# Patient Record
Sex: Female | Born: 1985 | Race: Asian | Hispanic: No | Marital: Single | Smoking: Never smoker
Health system: Southern US, Community
[De-identification: ages and names within clinical notes are randomized; demographics above are authoritative.]

## PROBLEM LIST (undated history)

## (undated) DIAGNOSIS — D649 Anemia, unspecified: Secondary | ICD-10-CM

## (undated) DIAGNOSIS — A599 Trichomoniasis, unspecified: Secondary | ICD-10-CM

## (undated) HISTORY — DX: Trichomoniasis, unspecified: A59.9

---
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
## 2005-07-07 ENCOUNTER — Emergency Department (HOSPITAL_COMMUNITY): Admission: EM | Admit: 2005-07-07 | Discharge: 2005-07-07 | Payer: Self-pay | Admitting: Emergency Medicine

## 2008-02-29 ENCOUNTER — Inpatient Hospital Stay (HOSPITAL_COMMUNITY): Admission: AD | Admit: 2008-02-29 | Discharge: 2008-02-29 | Payer: Self-pay | Admitting: Obstetrics & Gynecology

## 2008-08-23 ENCOUNTER — Inpatient Hospital Stay (HOSPITAL_COMMUNITY): Admission: AD | Admit: 2008-08-23 | Discharge: 2008-08-24 | Payer: Self-pay | Admitting: Obstetrics

## 2008-09-25 ENCOUNTER — Inpatient Hospital Stay (HOSPITAL_COMMUNITY): Admission: AD | Admit: 2008-09-25 | Discharge: 2008-09-25 | Payer: Self-pay | Admitting: Obstetrics

## 2008-10-03 ENCOUNTER — Inpatient Hospital Stay (HOSPITAL_COMMUNITY): Admission: RE | Admit: 2008-10-03 | Discharge: 2008-10-07 | Payer: Self-pay | Admitting: Obstetrics

## 2009-02-08 IMAGING — US US OB COMP LESS 14 WK
1 series · 14 of 23 positions shown · non-contrast
Comparison: none

OBSTETRICAL ULTRASOUND:
 This ultrasound exam was performed in the [HOSPITAL] Ultrasound Department.  The OB US report was generated in the AS system, and faxed to the ordering physician.  This report is also available in [REDACTED] PACS.

[Series 1: us ob comp less 14 wk · 23 acquisitions, 14 frames shown]
[im 1/23]
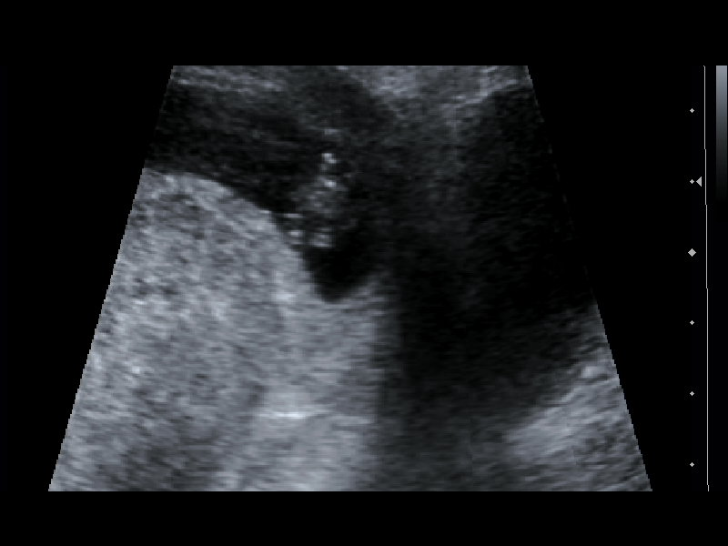
[im 3/23]
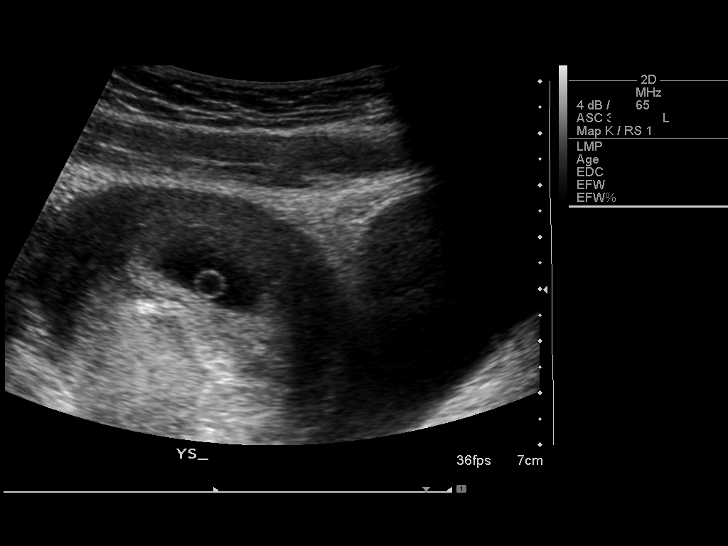
[im 5/23]
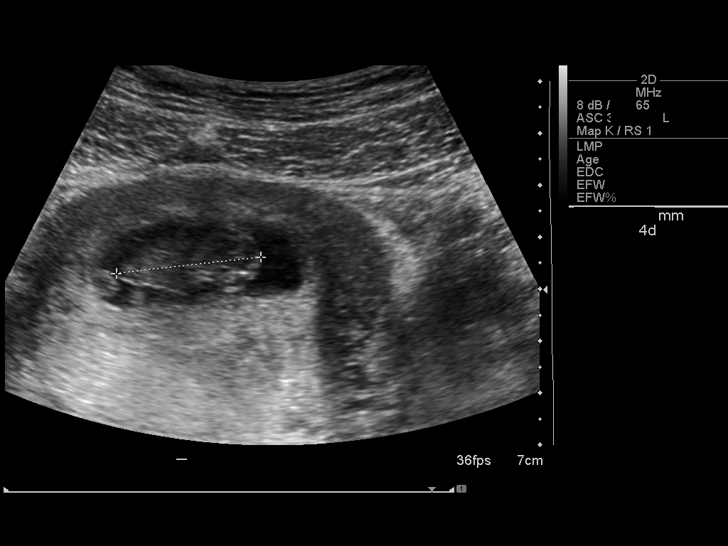
[im 6/23]
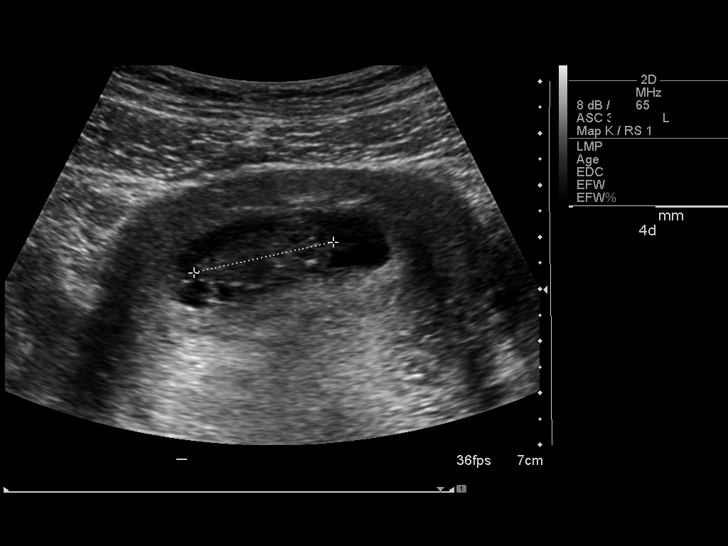
[im 8/23]
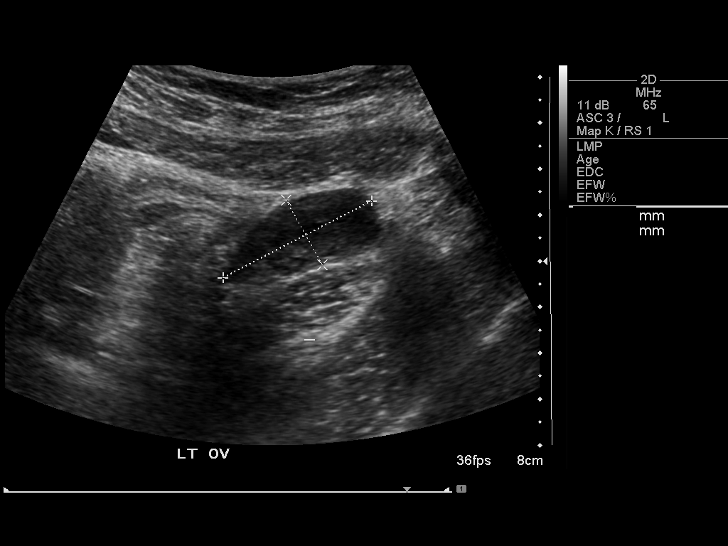
[im 10/23]
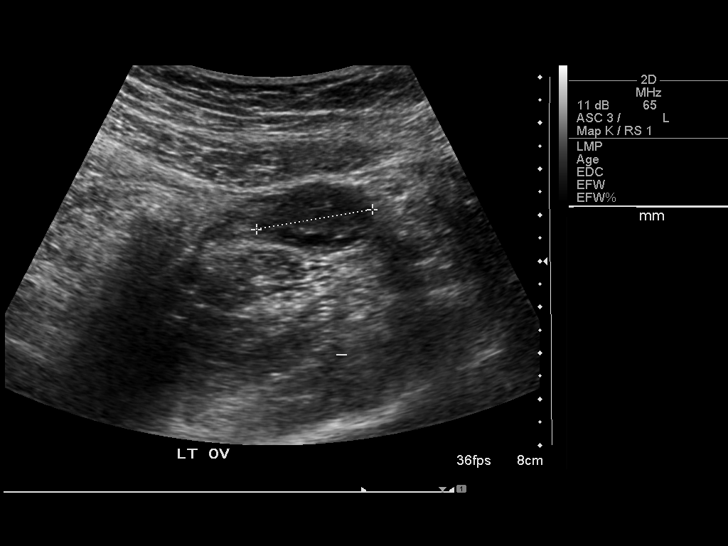
[im 11/23]
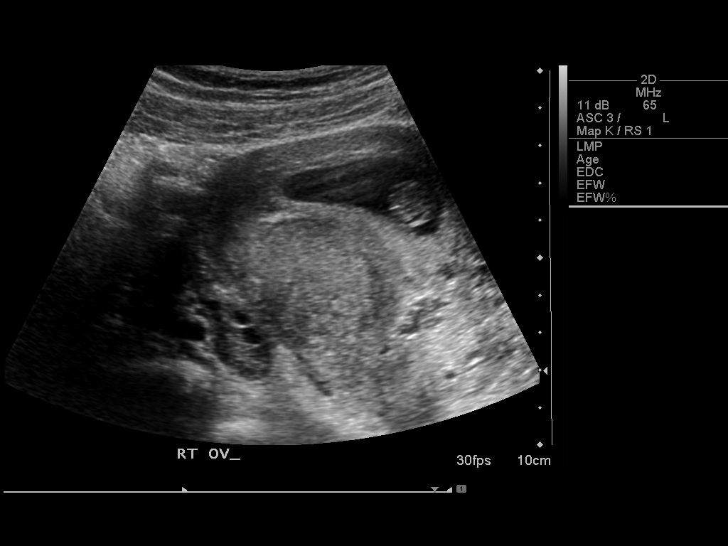
[im 13/23]
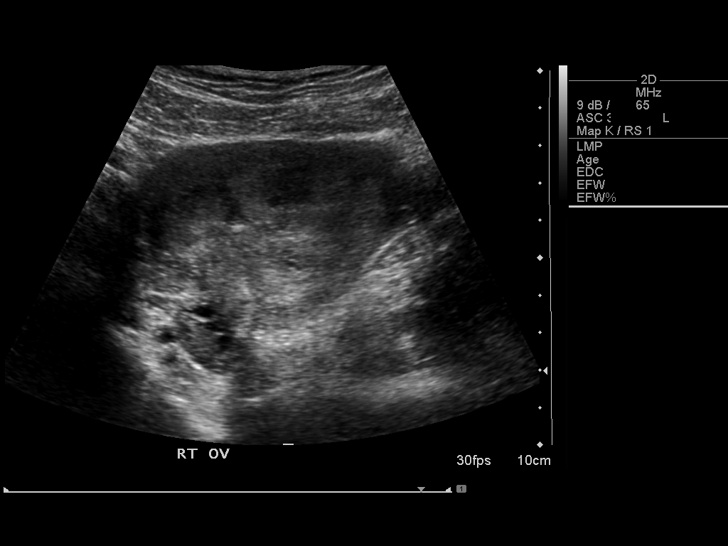
[im 14/23]
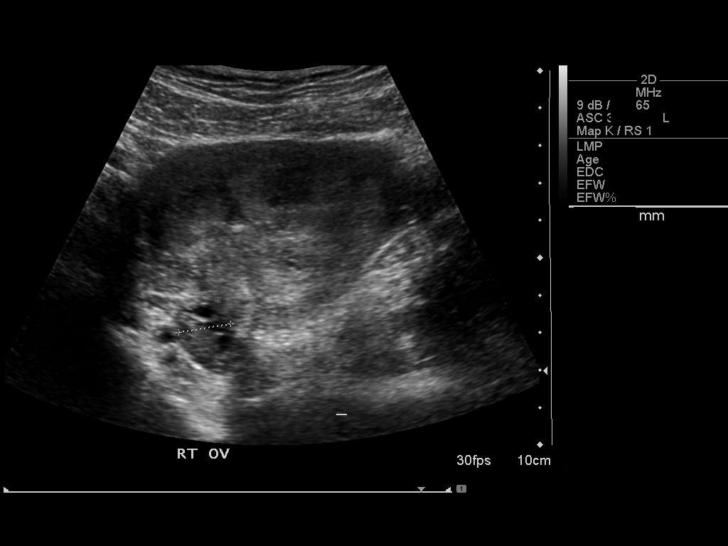
[im 16/23]
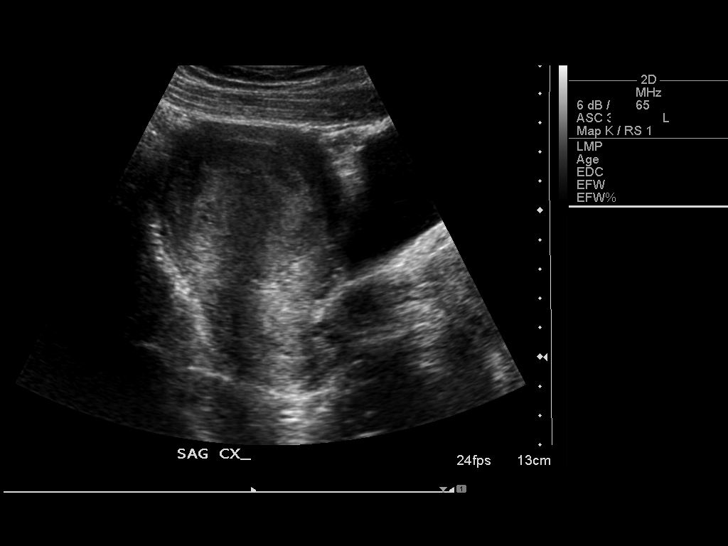
[im 18/23]
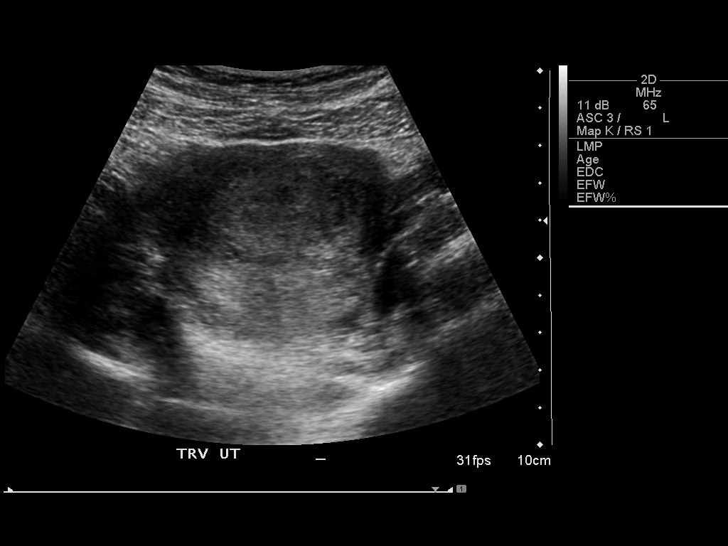
[im 19/23]
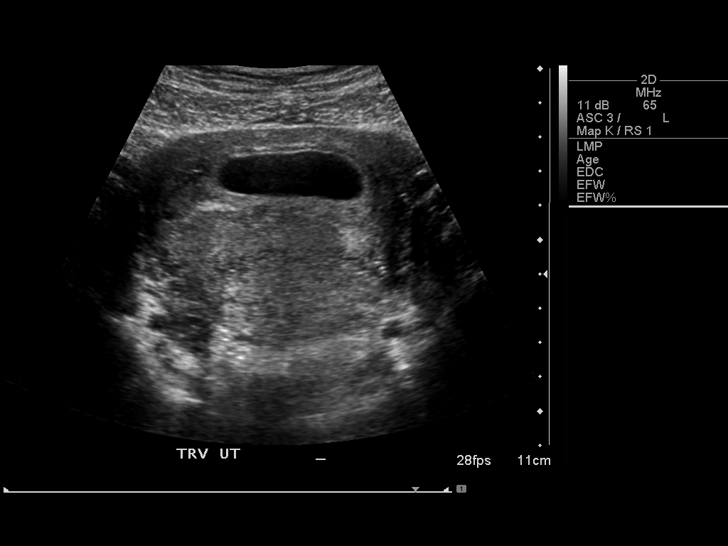
[im 21/23]
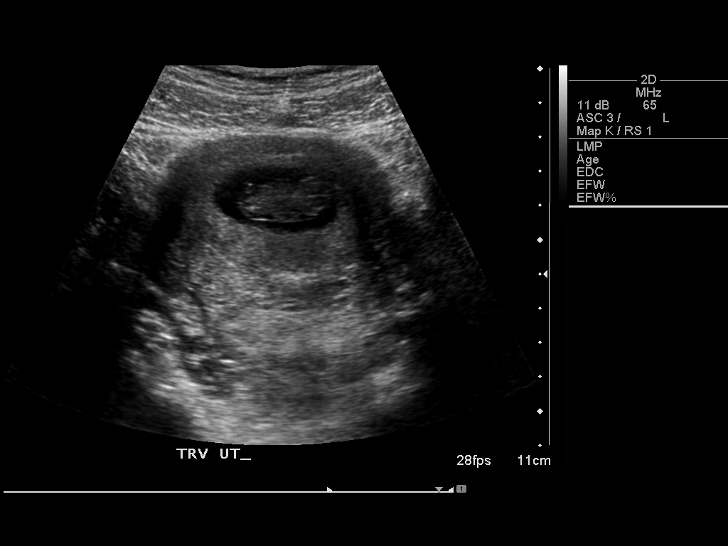
[im 23/23]
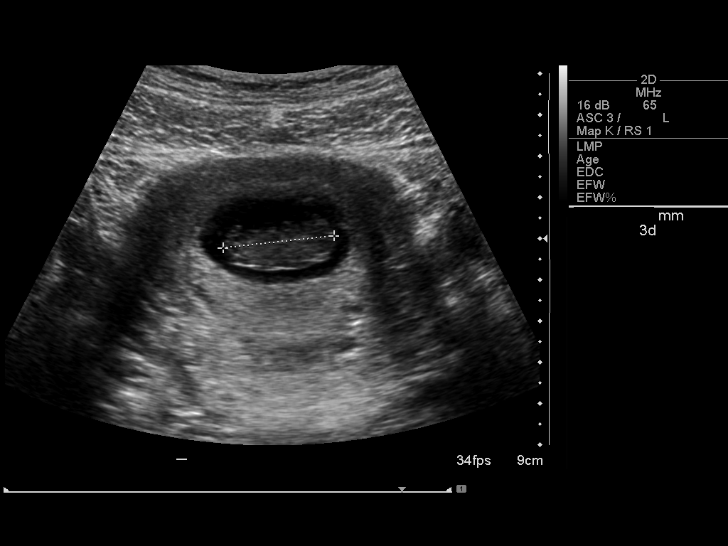

[14 of 23 positions shown; findings below may reference images not displayed]

IMPRESSION: See AS Obstetric US report.

## 2010-11-24 LAB — CBC
HCT: 35.4 % — ABNORMAL LOW (ref 36.0–46.0)
Hemoglobin: 11.7 g/dL — ABNORMAL LOW (ref 12.0–15.0)
Hemoglobin: 8.8 g/dL — ABNORMAL LOW (ref 12.0–15.0)
MCHC: 32.4 g/dL (ref 30.0–36.0)
Platelets: 289 10*3/uL (ref 150–400)
RBC: 3.21 MIL/uL — ABNORMAL LOW (ref 3.87–5.11)
WBC: 11.7 10*3/uL — ABNORMAL HIGH (ref 4.0–10.5)
WBC: 14.7 10*3/uL — ABNORMAL HIGH (ref 4.0–10.5)

## 2010-12-22 NOTE — Op Note (Signed)
NAMESORAYA, PAQUETTE NO.:  [id number]   MEDICAL RECORD NO.:  [id number]        PATIENT TYPE:  WINP   LOCATION:                                F[facility]ILITY:  [facility]   PHYSICIAN:  Kathreen Cosier, M.D.   DATE OF BIRTH:   DATE OF PROCEDURE:  10/04/2008  DATE OF DISCHARGE:                               OPERATIVE REPORT   PREOPERATIVE DIAGNOSES:  Non-reassuring fetal heart rate tracing, failed  induction.   POSTOPERATIVE DIAGNOSES:  Non-reassuring fetal heart rate tracing,  failed induction.   SURGEON:  Kathreen Cosier, MD   ANESTHESIA:  Epidural.   PROCEDURE:  The patient was placed on the operating table in supine  position.  Abdomen was prepped and draped.  Bladder was emptied with  Foley catheter.  Transverse suprapubic incision made carried down to  rectus fascia.  Fascia was cleaned and incised length of incision.  Recti muscles retracted laterally.  Peritoneum incised longitudinally.  Transverse incision made in the visceral peritoneum above the bladder.  Bladder mobilized inferiorly.  Transverse lower uterine incision made.  The patient delivered from OP position of a female Apgar 9 and 9 with a  tight nuchal cord cut, prior to delivery, the baby weighed 7 pounds.  Team was in attendance.  The fluid was clear.  The placenta was  posterior removed manually.  Uterine cavity cleaned with dry laps.  Uterine incision closed in one layer with continuous suture of #1  chromic.  Hemostasis was satisfactory.  Bladder flap reattached with 2-0  chromic.  Uterus was well contracted.  Tubes and ovaries normal.  Abdomen closed in layers, peritoneum continuous suture of 0 chromic,  fascia continuous suture of 0 Dexon and the skin closed with  subcuticular stitch of 4-0 Monocryl.  Blood loss 700 mL.           ______________________________  Kathreen Cosier, M.D.     BAM/MEDQ  D:  10/04/2008  T:  10/04/2008  Job:  [id number]

## 2010-12-22 NOTE — H&P (Signed)
NAMEHONEST, SAFRANEK                ACCOUNT NO.:  [id number]   MEDICAL RECORD NO.:  [id number]          PATIENT TYPE:  INP   LOCATION:  9168                          FACILITY:  [facility]   PHYSICIAN:  Kathreen Cosier, M.D.DATE OF BIRTH:  June 05, 1986   DATE OF ADMISSION:  10/03/2008  DATE OF DISCHARGE:                              HISTORY & PHYSICAL   HISTORY OF PRESENT ILLNESS:  The patient is a 25 year old gravida 2,  para 0-0-1-0, [facility] October 01, 2008, desired induction, and negative  GBS.  She was admitted with cervix 1-2 cm, 80% vertex, -1 to -2 station,  membranes are ruptured, fluid clear, and she was started on a low-dose  Pitocin at 8:20 p.m.  By 5 a.m. on October 04, 2008, cervix 5 cm, 100%,  vertex -1 to -2, having prolonged variables with each contractions.  IUPC was inserted and amnioinfusion began.  By 6:15, cervix unchanged,  and she had persistent variables and late with each contraction.  O2  administered.  Position change done.  No resolution.  It was decided she  will deliver from nonreassuring fetal heart rate tracing.   PHYSICAL EXAMINATION:  GENERAL:  Reveal a well-developed female.  HEENT:  Negative.  LUNGS:  Clear.  HEART:  Regular rhythm.  No murmurs.  No gallops.  BREASTS:  No masses.  ABDOMEN:  Term-sized uterus.  Estimated fetal weight was 14.  EXTREMITIES:  Negative.           ______________________________  Kathreen Cosier, M.D.     BAM/MEDQ  D:  10/04/2008  T:  10/04/2008  Job:  [id number]

## 2010-12-25 NOTE — Discharge Summary (Signed)
[name], [name]                ACCOUNT NO.:  [id number]   MEDICAL RECORD NO.:  [id number]          PATIENT TYPE:  INP   LOCATION:  9303                          [facility]ILITY:  [facility]   PHYSICIAN:  Kathreen Cosier, M.D.DATE OF BIRTH:  1986-01-18   DATE OF ADMISSION:  10/03/2008  DATE OF DISCHARGE:  10/07/2008                               DISCHARGE SUMMARY   The patient is a 25 year old gravida 2, para 0-0-1-0, [facility] October 01, 2008.  She desired induction.  On admission, her cervix was 1 to 2 cm,  80% vertex, -1 to -2.  The patient underwent primary low transverse  cesarean section with nonreassuring fetal heart rate tracing and had a  female, Apgars 9 and 9 with a tight nuchal cord, weighing 7 pounds from  the OP position.  Postop, she did well.  On admission, her hemoglobin is  11.7, postop 8.8 and white count 11.7 and 14.7.  RPR negative.  HIV  negative.  She was discharged on the third postoperative day,  ambulatory, on a regular diet to see me in 6 weeks.   DISCHARGE DIAGNOSES:  Status post primary low transverse cesarean  section at term because of nonreassuring fetal heart rate tracing.           ______________________________  Kathreen Cosier, M.D.     BAM/MEDQ  D:  10/30/2008  T:  10/31/2008  Job:  [id number]

## 2013-01-22 ENCOUNTER — Inpatient Hospital Stay (HOSPITAL_COMMUNITY): Admission: AD | Admit: 2013-01-22 | Payer: Self-pay | Source: Ambulatory Visit | Admitting: Obstetrics & Gynecology

## 2013-01-23 LAB — OB RESULTS CONSOLE RPR: RPR: NONREACTIVE

## 2013-01-23 LAB — OB RESULTS CONSOLE HEPATITIS B SURFACE ANTIGEN: Hepatitis B Surface Ag: NEGATIVE

## 2013-01-23 LAB — OB RESULTS CONSOLE ABO/RH: RH Type: POSITIVE

## 2013-01-23 LAB — OB RESULTS CONSOLE HIV ANTIBODY (ROUTINE TESTING): HIV: NONREACTIVE

## 2013-01-23 LAB — OB RESULTS CONSOLE GC/CHLAMYDIA: Gonorrhea: NEGATIVE

## 2013-01-23 LAB — OB RESULTS CONSOLE RUBELLA ANTIBODY, IGM: Rubella: IMMUNE

## 2013-06-13 LAB — OB RESULTS CONSOLE GC/CHLAMYDIA
Chlamydia: NEGATIVE
Gonorrhea: NEGATIVE

## 2013-06-13 LAB — OB RESULTS CONSOLE GBS: GBS: NEGATIVE

## 2013-07-19 ENCOUNTER — Telehealth (HOSPITAL_COMMUNITY): Payer: Self-pay | Admitting: *Deleted

## 2013-07-19 ENCOUNTER — Encounter (HOSPITAL_COMMUNITY): Payer: Self-pay | Admitting: *Deleted

## 2013-07-19 NOTE — Telephone Encounter (Signed)
Preadmission screen  

## 2013-07-23 ENCOUNTER — Inpatient Hospital Stay (HOSPITAL_COMMUNITY): Payer: Medicaid Other | Admitting: Anesthesiology

## 2013-07-23 ENCOUNTER — Encounter (HOSPITAL_COMMUNITY): Payer: Self-pay

## 2013-07-23 ENCOUNTER — Inpatient Hospital Stay (HOSPITAL_COMMUNITY)
Admission: RE | Admit: 2013-07-23 | Discharge: 2013-07-26 | DRG: 766 | Disposition: A | Discharge status: Home / Self Care | Payer: Medicaid Other | Source: Ambulatory Visit | Attending: Obstetrics | Admitting: Obstetrics

## 2013-07-23 ENCOUNTER — Encounter (HOSPITAL_COMMUNITY): Payer: Medicaid Other | Admitting: Anesthesiology

## 2013-07-23 DIAGNOSIS — Z98891 History of uterine scar from previous surgery: Secondary | ICD-10-CM

## 2013-07-23 DIAGNOSIS — Z8759 Personal history of other complications of pregnancy, childbirth and the puerperium: Secondary | ICD-10-CM

## 2013-07-23 DIAGNOSIS — O34219 Maternal care for unspecified type scar from previous cesarean delivery: Secondary | ICD-10-CM | POA: Diagnosis present

## 2013-07-23 LAB — CBC
HCT: 31.7 % — ABNORMAL LOW (ref 36.0–46.0)
Hemoglobin: 10.4 g/dL — ABNORMAL LOW (ref 12.0–15.0)
MCH: 25.3 pg — ABNORMAL LOW (ref 26.0–34.0)
MCHC: 32.8 g/dL (ref 30.0–36.0)
Platelets: 212 10*3/uL (ref 150–400)
WBC: 10.4 10*3/uL (ref 4.0–10.5)

## 2013-07-23 LAB — RPR: RPR Ser Ql: NONREACTIVE

## 2013-07-23 LAB — TYPE AND SCREEN: ABO/RH(D): O POS

## 2013-07-23 LAB — ABO/RH: ABO/RH(D): O POS

## 2013-07-23 MED ORDER — OXYTOCIN 40 UNITS IN LACTATED RINGERS INFUSION - SIMPLE MED: 62.5000 mL/h | INTRAVENOUS | Status: DC

## 2013-07-23 MED ORDER — LACTATED RINGERS IV SOLN
500.0000 mL | Freq: Once | INTRAVENOUS | Status: AC
  Administered 2013-07-23: 1000 mL via INTRAVENOUS

## 2013-07-23 MED ORDER — IBUPROFEN 600 MG PO TABS: 600.0000 mg | ORAL_TABLET | Freq: Four times a day (QID) | ORAL | Status: DC | PRN

## 2013-07-23 MED ORDER — FENTANYL 2.5 MCG/ML BUPIVACAINE 1/10 % EPIDURAL INFUSION (WH - ANES)
INTRAMUSCULAR | Status: DC | PRN
  Administered 2013-07-23: 12.5 mL/h via EPIDURAL
  Administered 2013-07-23: 23:00:00

## 2013-07-23 MED ORDER — LACTATED RINGERS IV SOLN
500.0000 mL | INTRAVENOUS | Status: DC | PRN
  Administered 2013-07-24: 500 mL via INTRAVENOUS

## 2013-07-23 MED ORDER — ONDANSETRON HCL 4 MG/2ML IJ SOLN: 4.0000 mg | Freq: Four times a day (QID) | INTRAMUSCULAR | Status: DC | PRN

## 2013-07-23 MED ORDER — CITRIC ACID-SODIUM CITRATE 334-500 MG/5ML PO SOLN
30.0000 mL | ORAL | Status: DC | PRN
  Administered 2013-07-24: 30 mL via ORAL
  Filled 2013-07-23: qty 15

## 2013-07-23 MED ORDER — OXYCODONE-ACETAMINOPHEN 5-325 MG PO TABS: 1.0000 | ORAL_TABLET | ORAL | Status: DC | PRN

## 2013-07-23 MED ORDER — FLEET ENEMA 7-19 GM/118ML RE ENEM
1.0000 | ENEMA | RECTAL | Status: DC | PRN
Start: 2013-07-23 — End: 2013-07-24

## 2013-07-23 MED ORDER — EPHEDRINE 5 MG/ML INJ: 10.0000 mg | INTRAVENOUS | Status: DC | PRN

## 2013-07-23 MED ORDER — TERBUTALINE SULFATE 1 MG/ML IJ SOLN: 0.2500 mg | Freq: Once | INTRAMUSCULAR | Status: AC | PRN

## 2013-07-23 MED ORDER — BUTORPHANOL TARTRATE 1 MG/ML IJ SOLN: 1.0000 mg | INTRAMUSCULAR | Status: DC | PRN

## 2013-07-23 MED ORDER — LACTATED RINGERS IV SOLN
INTRAVENOUS | Status: DC
  Administered 2013-07-23 – 2013-07-24 (×6): via INTRAVENOUS

## 2013-07-23 MED ORDER — FENTANYL 2.5 MCG/ML BUPIVACAINE 1/10 % EPIDURAL INFUSION (WH - ANES)
14.0000 mL/h | INTRAMUSCULAR | Status: DC | PRN
  Filled 2013-07-23 (×2): qty 125

## 2013-07-23 MED ORDER — PHENYLEPHRINE 40 MCG/ML (10ML) SYRINGE FOR IV PUSH (FOR BLOOD PRESSURE SUPPORT)
80.0000 ug | PREFILLED_SYRINGE | INTRAVENOUS | Status: DC | PRN
Start: 2013-07-23 — End: 2013-07-24
  Filled 2013-07-23: qty 10

## 2013-07-23 MED ORDER — DIPHENHYDRAMINE HCL 50 MG/ML IJ SOLN: 12.5000 mg | INTRAMUSCULAR | Status: DC | PRN

## 2013-07-23 MED ORDER — OXYTOCIN 40 UNITS IN LACTATED RINGERS INFUSION - SIMPLE MED
1.0000 m[IU]/min | INTRAVENOUS | Status: DC
  Administered 2013-07-23: 2 m[IU]/min via INTRAVENOUS
  Filled 2013-07-23: qty 1000

## 2013-07-23 MED ORDER — OXYTOCIN BOLUS FROM INFUSION: 500.0000 mL | INTRAVENOUS | Status: DC

## 2013-07-23 MED ORDER — LIDOCAINE HCL (PF) 1 % IJ SOLN: 30.0000 mL | INTRAMUSCULAR | Status: DC | PRN

## 2013-07-23 MED ORDER — LIDOCAINE HCL (PF) 1 % IJ SOLN
INTRAMUSCULAR | Status: DC | PRN
  Administered 2013-07-23 (×2): 4 mL

## 2013-07-23 MED ORDER — EPHEDRINE 5 MG/ML INJ
10.0000 mg | INTRAVENOUS | Status: DC | PRN
  Filled 2013-07-23: qty 4

## 2013-07-23 MED ORDER — PHENYLEPHRINE 40 MCG/ML (10ML) SYRINGE FOR IV PUSH (FOR BLOOD PRESSURE SUPPORT): 80.0000 ug | PREFILLED_SYRINGE | INTRAVENOUS | Status: DC | PRN

## 2013-07-23 MED ORDER — ACETAMINOPHEN 325 MG PO TABS
650.0000 mg | ORAL_TABLET | ORAL | Status: DC | PRN
  Administered 2013-07-24: 650 mg via ORAL
  Filled 2013-07-23: qty 2

## 2013-07-23 NOTE — H&P (Signed)
This is Dr. Francoise Ceo dictating the history and physical on  [name] she's a 27 year old gravida 3 para 1011 and a previous C-section for nonreassuring fetal heart rate tracing she's no 40 weeks and 4 days EDC 1211 and she's brought in for induction negative GBS cervix fingertip vertex -3 and she is on low-dose Pitocin Past medical history negative Past surgical history previous C-section Social history negative Physical exam well-developed female in no distress HEENT negative Lungs clear to P&A Heart regular rhythm no murmurs no gallops Breasts engorged Abdomen term Pelvic as described above Extremities negative and and

## 2013-07-23 NOTE — Anesthesia Preprocedure Evaluation (Addendum)
Anesthesia Evaluation  Patient identified by MRN, date of birth, ID band Patient awake    Reviewed: Allergy & Precautions, H&P , Patient's Chart, lab work & pertinent test results  Airway Mallampati: III TM Distance: >3 FB Neck ROM: Full    Dental no notable dental hx. (+) Teeth Intact   Pulmonary neg pulmonary ROS,  breath sounds clear to auscultation  Pulmonary exam normal       Cardiovascular negative cardio ROS  Rhythm:Regular Rate:Normal     Neuro/Psych negative neurological ROS  negative psych ROS   GI/Hepatic negative GI ROS, Neg liver ROS,   Endo/Other  negative endocrine ROSObesity  Renal/GU negative Renal ROS  negative genitourinary   Musculoskeletal negative musculoskeletal ROS (+)   Abdominal (+) + obese,   Peds  Hematology  (+) anemia ,   Anesthesia Other Findings   Reproductive/Obstetrics (+) Pregnancy (failed TOLAC --> C/S) Previous C/Section                         Anesthesia Physical Anesthesia Plan  ASA: II and emergent  Anesthesia Plan: Epidural   Post-op Pain Management:    Induction: Intravenous  Airway Management Planned: Natural Airway  Additional Equipment:   Intra-op Plan:   Post-operative Plan:   Informed Consent: I have reviewed the patients History and Physical, chart, labs and discussed the procedure including the risks, benefits and alternatives for the proposed anesthesia with the patient or authorized representative who has indicated his/her understanding and acceptance.     Plan Discussed with: Anesthesiologist, Surgeon and CRNA  Anesthesia Plan Comments:        Anesthesia Quick Evaluation

## 2013-07-23 NOTE — Anesthesia Procedure Notes (Signed)
Epidural Patient location during procedure: OB Start time: 07/23/2013 2:52 PM  Staffing Anesthesiologist: Nalda Shackleford A. Performed by: anesthesiologist   Preanesthetic Checklist Completed: patient identified, site marked, surgical consent, pre-op evaluation, timeout performed, IV checked, risks and benefits discussed and monitors and equipment checked  Epidural Patient position: sitting Prep: site prepped and draped and DuraPrep Patient monitoring: continuous pulse ox and blood pressure Approach: midline Injection technique: LOR air  Needle:  Needle type: Tuohy  Needle gauge: 17 G Needle length: 9 cm and 9 Needle insertion depth: 5 cm cm Catheter type: closed end flexible Catheter size: 19 Gauge Catheter at skin depth: 10 cm Test dose: negative and Other  Assessment Events: blood not aspirated, injection not painful, no injection resistance, negative IV test and no paresthesia  Additional Notes Patient identified. Risks and benefits discussed including failed block, incomplete  Pain control, post dural puncture headache, nerve damage, paralysis, blood pressure Changes, nausea, vomiting, reactions to medications-both toxic and allergic and post Partum back pain. All questions were answered. Patient expressed understanding and wished to proceed. Sterile technique was used throughout procedure. Epidural site was Dressed with sterile barrier dressing. No paresthesias, signs of intravascular injection Or signs of intrathecal spread were encountered.  Patient was more comfortable after the epidural was dosed. Please see RN's note for documentation of vital signs and FHR which are stable.

## 2013-07-24 ENCOUNTER — Encounter (HOSPITAL_COMMUNITY)
Admission: RE | Disposition: A | Discharge status: Home / Self Care | Payer: Self-pay | Source: Ambulatory Visit | Attending: Obstetrics

## 2013-07-24 ENCOUNTER — Encounter (HOSPITAL_COMMUNITY): Payer: Self-pay

## 2013-07-24 DIAGNOSIS — Z98891 History of uterine scar from previous surgery: Secondary | ICD-10-CM

## 2013-07-24 SURGERY — Surgical Case
Anesthesia: Epidural | Site: Abdomen

## 2013-07-24 MED ORDER — LANOLIN HYDROUS EX OINT: 1.0000 "application " | TOPICAL_OINTMENT | CUTANEOUS | Status: DC | PRN

## 2013-07-24 MED ORDER — SIMETHICONE 80 MG PO CHEW: 80.0000 mg | CHEWABLE_TABLET | ORAL | Status: DC | PRN

## 2013-07-24 MED ORDER — LACTATED RINGERS IV SOLN
INTRAVENOUS | Status: DC
  Administered 2013-07-24: 18:00:00 via INTRAVENOUS

## 2013-07-24 MED ORDER — ONDANSETRON HCL 4 MG/2ML IJ SOLN
INTRAMUSCULAR | Status: AC
  Filled 2013-07-24: qty 2

## 2013-07-24 MED ORDER — SIMETHICONE 80 MG PO CHEW
80.0000 mg | CHEWABLE_TABLET | Freq: Three times a day (TID) | ORAL | Status: DC
  Administered 2013-07-24 – 2013-07-25 (×6): 80 mg via ORAL
  Filled 2013-07-24 (×7): qty 1

## 2013-07-24 MED ORDER — DIBUCAINE 1 % RE OINT: 1.0000 "application " | TOPICAL_OINTMENT | RECTAL | Status: DC | PRN

## 2013-07-24 MED ORDER — FENTANYL CITRATE 0.05 MG/ML IJ SOLN: 25.0000 ug | INTRAMUSCULAR | Status: DC | PRN

## 2013-07-24 MED ORDER — NALOXONE HCL 1 MG/ML IJ SOLN
1.0000 ug/kg/h | INTRAVENOUS | Status: DC | PRN
  Filled 2013-07-24: qty 2

## 2013-07-24 MED ORDER — OXYCODONE-ACETAMINOPHEN 5-325 MG PO TABS
1.0000 | ORAL_TABLET | ORAL | Status: DC | PRN
  Administered 2013-07-25: 2 via ORAL
  Administered 2013-07-25 – 2013-07-26 (×5): 1 via ORAL
  Filled 2013-07-24 (×2): qty 1
  Filled 2013-07-24: qty 2
  Filled 2013-07-24 (×3): qty 1

## 2013-07-24 MED ORDER — MORPHINE SULFATE 0.5 MG/ML IJ SOLN
INTRAMUSCULAR | Status: AC
  Filled 2013-07-24: qty 10

## 2013-07-24 MED ORDER — SENNOSIDES-DOCUSATE SODIUM 8.6-50 MG PO TABS
2.0000 | ORAL_TABLET | ORAL | Status: DC
  Administered 2013-07-25: 2 via ORAL
  Filled 2013-07-24 (×2): qty 2

## 2013-07-24 MED ORDER — OXYTOCIN 40 UNITS IN LACTATED RINGERS INFUSION - SIMPLE MED: 62.5000 mL/h | INTRAVENOUS | Status: AC

## 2013-07-24 MED ORDER — IBUPROFEN 600 MG PO TABS
600.0000 mg | ORAL_TABLET | Freq: Four times a day (QID) | ORAL | Status: DC
  Administered 2013-07-24 – 2013-07-26 (×7): 600 mg via ORAL
  Filled 2013-07-24 (×7): qty 1

## 2013-07-24 MED ORDER — LIDOCAINE-EPINEPHRINE (PF) 2 %-1:200000 IJ SOLN
INTRAMUSCULAR | Status: AC
  Filled 2013-07-24: qty 20

## 2013-07-24 MED ORDER — LACTATED RINGERS IV SOLN
INTRAVENOUS | Status: DC
  Administered 2013-07-24 (×2): via INTRAUTERINE

## 2013-07-24 MED ORDER — OXYTOCIN 10 UNIT/ML IJ SOLN
40.0000 [IU] | INTRAVENOUS | Status: DC | PRN
  Administered 2013-07-24: 40 [IU] via INTRAVENOUS

## 2013-07-24 MED ORDER — NALBUPHINE SYRINGE 5 MG/0.5 ML
5.0000 mg | INJECTION | INTRAMUSCULAR | Status: DC | PRN
  Filled 2013-07-24: qty 1

## 2013-07-24 MED ORDER — SODIUM BICARBONATE 8.4 % IV SOLN
INTRAVENOUS | Status: AC
  Filled 2013-07-24: qty 50

## 2013-07-24 MED ORDER — SIMETHICONE 80 MG PO CHEW
80.0000 mg | CHEWABLE_TABLET | ORAL | Status: DC
  Administered 2013-07-25: 80 mg via ORAL
  Filled 2013-07-24 (×2): qty 1

## 2013-07-24 MED ORDER — ONDANSETRON HCL 4 MG/2ML IJ SOLN: 4.0000 mg | Freq: Three times a day (TID) | INTRAMUSCULAR | Status: DC | PRN

## 2013-07-24 MED ORDER — SODIUM BICARBONATE 8.4 % IV SOLN
INTRAVENOUS | Status: DC | PRN
  Administered 2013-07-24 (×2): 5 mL via EPIDURAL

## 2013-07-24 MED ORDER — METOCLOPRAMIDE HCL 5 MG/ML IJ SOLN: 10.0000 mg | Freq: Once | INTRAMUSCULAR | Status: DC | PRN

## 2013-07-24 MED ORDER — ETOMIDATE 2 MG/ML IV SOLN
INTRAVENOUS | Status: AC
  Filled 2013-07-24: qty 10

## 2013-07-24 MED ORDER — DIPHENHYDRAMINE HCL 25 MG PO CAPS
25.0000 mg | ORAL_CAPSULE | ORAL | Status: DC | PRN
  Filled 2013-07-24: qty 1

## 2013-07-24 MED ORDER — PHENYLEPHRINE HCL 10 MG/ML IJ SOLN
INTRAMUSCULAR | Status: DC | PRN
  Administered 2013-07-24: 40 ug via INTRAVENOUS

## 2013-07-24 MED ORDER — ONDANSETRON HCL 4 MG/2ML IJ SOLN: 4.0000 mg | INTRAMUSCULAR | Status: DC | PRN

## 2013-07-24 MED ORDER — PHENYLEPHRINE 40 MCG/ML (10ML) SYRINGE FOR IV PUSH (FOR BLOOD PRESSURE SUPPORT)
PREFILLED_SYRINGE | INTRAVENOUS | Status: AC
  Filled 2013-07-24: qty 5

## 2013-07-24 MED ORDER — OXYTOCIN 10 UNIT/ML IJ SOLN
INTRAMUSCULAR | Status: AC
  Filled 2013-07-24: qty 4

## 2013-07-24 MED ORDER — TETANUS-DIPHTH-ACELL PERTUSSIS 5-2.5-18.5 LF-MCG/0.5 IM SUSP: 0.5000 mL | Freq: Once | INTRAMUSCULAR | Status: DC

## 2013-07-24 MED ORDER — KETOROLAC TROMETHAMINE 60 MG/2ML IM SOLN
INTRAMUSCULAR | Status: AC
  Filled 2013-07-24: qty 2

## 2013-07-24 MED ORDER — ONDANSETRON HCL 4 MG/2ML IJ SOLN
INTRAMUSCULAR | Status: DC | PRN
  Administered 2013-07-24: 4 mg via INTRAVENOUS

## 2013-07-24 MED ORDER — SCOPOLAMINE 1 MG/3DAYS TD PT72: 1.0000 | MEDICATED_PATCH | Freq: Once | TRANSDERMAL | Status: DC

## 2013-07-24 MED ORDER — MORPHINE SULFATE (PF) 0.5 MG/ML IJ SOLN
INTRAMUSCULAR | Status: DC | PRN
  Administered 2013-07-24: 3 mg via EPIDURAL
  Administered 2013-07-24: 2 mg via INTRAVENOUS

## 2013-07-24 MED ORDER — MORPHINE SULFATE (PF) 0.5 MG/ML IJ SOLN: INTRAMUSCULAR | Status: DC | PRN

## 2013-07-24 MED ORDER — MENTHOL 3 MG MT LOZG: 1.0000 | LOZENGE | OROMUCOSAL | Status: DC | PRN

## 2013-07-24 MED ORDER — SODIUM CHLORIDE 0.9 % IV SOLN
2.0000 g | Freq: Four times a day (QID) | INTRAVENOUS | Status: DC
  Administered 2013-07-24: 2 g via INTRAVENOUS
  Filled 2013-07-24 (×3): qty 2000

## 2013-07-24 MED ORDER — PRENATAL MULTIVITAMIN CH
1.0000 | ORAL_TABLET | Freq: Every day | ORAL | Status: DC
  Administered 2013-07-25: 1 via ORAL
  Filled 2013-07-24: qty 1

## 2013-07-24 MED ORDER — ZOLPIDEM TARTRATE 5 MG PO TABS: 5.0000 mg | ORAL_TABLET | Freq: Every evening | ORAL | Status: DC | PRN

## 2013-07-24 MED ORDER — ONDANSETRON HCL 4 MG PO TABS
4.0000 mg | ORAL_TABLET | ORAL | Status: DC | PRN
  Administered 2013-07-25: 4 mg via ORAL
  Filled 2013-07-24: qty 1

## 2013-07-24 MED ORDER — METOCLOPRAMIDE HCL 5 MG/ML IJ SOLN
10.0000 mg | Freq: Three times a day (TID) | INTRAMUSCULAR | Status: DC | PRN
  Administered 2013-07-24: 10 mg via INTRAVENOUS
  Filled 2013-07-24: qty 2

## 2013-07-24 MED ORDER — MEPERIDINE HCL 25 MG/ML IJ SOLN: 6.2500 mg | INTRAMUSCULAR | Status: DC | PRN

## 2013-07-24 MED ORDER — METHYLERGONOVINE MALEATE 0.2 MG/ML IJ SOLN
INTRAMUSCULAR | Status: DC | PRN
  Administered 2013-07-24: 0.2 mg via INTRAMUSCULAR

## 2013-07-24 MED ORDER — KETOROLAC TROMETHAMINE 30 MG/ML IJ SOLN
30.0000 mg | Freq: Four times a day (QID) | INTRAMUSCULAR | Status: AC | PRN
  Administered 2013-07-24: 30 mg via INTRAVENOUS
  Filled 2013-07-24: qty 1

## 2013-07-24 MED ORDER — METHYLERGONOVINE MALEATE 0.2 MG/ML IJ SOLN
INTRAMUSCULAR | Status: AC
  Filled 2013-07-24: qty 1

## 2013-07-24 MED ORDER — DIPHENHYDRAMINE HCL 50 MG/ML IJ SOLN: 25.0000 mg | INTRAMUSCULAR | Status: DC | PRN

## 2013-07-24 MED ORDER — SODIUM CHLORIDE 0.9 % IJ SOLN: 3.0000 mL | INTRAMUSCULAR | Status: DC | PRN

## 2013-07-24 MED ORDER — KETOROLAC TROMETHAMINE 60 MG/2ML IM SOLN
60.0000 mg | Freq: Once | INTRAMUSCULAR | Status: AC | PRN
  Administered 2013-07-24: 60 mg via INTRAMUSCULAR

## 2013-07-24 MED ORDER — DIPHENHYDRAMINE HCL 50 MG/ML IJ SOLN: 12.5000 mg | INTRAMUSCULAR | Status: DC | PRN

## 2013-07-24 MED ORDER — KETOROLAC TROMETHAMINE 30 MG/ML IJ SOLN: 30.0000 mg | Freq: Four times a day (QID) | INTRAMUSCULAR | Status: AC | PRN

## 2013-07-24 MED ORDER — WITCH HAZEL-GLYCERIN EX PADS: 1.0000 "application " | MEDICATED_PAD | CUTANEOUS | Status: DC | PRN

## 2013-07-24 MED ORDER — DIPHENHYDRAMINE HCL 25 MG PO CAPS: 25.0000 mg | ORAL_CAPSULE | Freq: Four times a day (QID) | ORAL | Status: DC | PRN

## 2013-07-24 MED ORDER — NALOXONE HCL 0.4 MG/ML IJ SOLN: 0.4000 mg | INTRAMUSCULAR | Status: DC | PRN

## 2013-07-24 MED ORDER — CEFAZOLIN SODIUM-DEXTROSE 2-3 GM-% IV SOLR
2.0000 g | Freq: Once | INTRAVENOUS | Status: AC
  Administered 2013-07-24 (×2): 2 g via INTRAVENOUS
  Filled 2013-07-24: qty 50

## 2013-07-24 SURGICAL SUPPLY — 30 items
CLAMP CORD UMBIL (MISCELLANEOUS) IMPLANT
CLOTH BEACON ORANGE TIMEOUT ST (SAFETY) ×2 IMPLANT
DERMABOND ADVANCED (GAUZE/BANDAGES/DRESSINGS) ×1
DERMABOND ADVANCED .7 DNX12 (GAUZE/BANDAGES/DRESSINGS) ×1 IMPLANT
DRAPE LG THREE QUARTER DISP (DRAPES) IMPLANT
DRSG OPSITE POSTOP 4X10 (GAUZE/BANDAGES/DRESSINGS) ×2 IMPLANT
DURAPREP 26ML APPLICATOR (WOUND CARE) ×2 IMPLANT
ELECT REM PT RETURN 9FT ADLT (ELECTROSURGICAL) ×2
ELECTRODE REM PT RTRN 9FT ADLT (ELECTROSURGICAL) ×1 IMPLANT
EXTRACTOR VACUUM M CUP 4 TUBE (SUCTIONS) IMPLANT
GLOVE BIO SURGEON STRL SZ8.5 (GLOVE) ×2 IMPLANT
GOWN PREVENTION PLUS XLARGE (GOWN DISPOSABLE) ×2 IMPLANT
GOWN PREVENTION PLUS XXLARGE (GOWN DISPOSABLE) ×2 IMPLANT
KIT ABG SYR 3ML LUER SLIP (SYRINGE) IMPLANT
NEEDLE HYPO 25X5/8 SAFETYGLIDE (NEEDLE) ×2 IMPLANT
NS IRRIG 1000ML POUR BTL (IV SOLUTION) ×2 IMPLANT
PACK C SECTION WH (CUSTOM PROCEDURE TRAY) ×2 IMPLANT
PAD OB MATERNITY 4.3X12.25 (PERSONAL CARE ITEMS) ×2 IMPLANT
SUT CHROMIC 0 CT 802H (SUTURE) ×2 IMPLANT
SUT CHROMIC 1 CTX 36 (SUTURE) ×4 IMPLANT
SUT CHROMIC 2 0 SH (SUTURE) ×2 IMPLANT
SUT GUT PLAIN 0 CT-3 TAN 27 (SUTURE) IMPLANT
SUT MON AB 4-0 PS1 27 (SUTURE) ×2 IMPLANT
SUT VIC AB 0 CT1 18XCR BRD8 (SUTURE) IMPLANT
SUT VIC AB 0 CT1 8-18 (SUTURE)
SUT VIC AB 0 CTX 36 (SUTURE) ×2
SUT VIC AB 0 CTX36XBRD ANBCTRL (SUTURE) ×2 IMPLANT
TOWEL OR 17X24 6PK STRL BLUE (TOWEL DISPOSABLE) ×2 IMPLANT
TRAY FOLEY CATH 14FR (SET/KITS/TRAYS/PACK) ×2 IMPLANT
WATER STERILE IRR 1000ML POUR (IV SOLUTION) ×2 IMPLANT

## 2013-07-24 NOTE — Progress Notes (Signed)
UO . Foley ballon deflated, pushed up and inflated. Will continue to monitor

## 2013-07-24 NOTE — Anesthesia Postprocedure Evaluation (Signed)
  Anesthesia Post-op Note  Patient: [name]  Procedure(s) Performed: Procedure(s): CESAREAN SECTION (N/A)  Patient is awake, responsive, moving her legs, and has signs of resolution of her numbness. Pain and nausea are reasonably well controlled. Vital signs are stable and clinically acceptable. Oxygen saturation is clinically acceptable. There are no apparent anesthetic complications at this time. Patient is ready for discharge.

## 2013-07-24 NOTE — Progress Notes (Signed)
Patient ID: [name], female   DOB: Jul 18, 1986, 27 y.o.   MRN: [id number] Cervix is 6 cm 100% and the vertex at -2-3 station she reportedly has variables when she's  Not on her right side   an IUPC was inserted and amnioinfusion began 325 Ringer's lactate 125  Per hour she is on 12 milliunits of Pitocin at this time

## 2013-07-24 NOTE — Progress Notes (Signed)
Patient ID: [name], female   DOB: [birth year], 27 y.o.   MRN: [id number] Patient is still 6 cm she's been sick since midnight and has been in adequate labor and the vertex was more -2 to -3 she was delivered by C-section repeat because of failure to progress in labor failed induction

## 2013-07-24 NOTE — Anesthesia Postprocedure Evaluation (Signed)
  Anesthesia Post-op Note  Patient: [name]  Procedure(s) Performed: Procedure(s): CESAREAN SECTION (N/A)  Patient Location: PACU and Mother/Baby  Anesthesia Type:Epidural  Level of Consciousness: awake, alert  and oriented  Airway and Oxygen Therapy: Patient Spontanous Breathing  Post-op Pain: mild  Post-op Assessment: PATIENT'S CARDIOVASCULAR STATUS UNSTABLE, RESPIRATORY FUNCTION UNSTABLE, No signs of Nausea or vomiting and Pain level controlled  Post-op Vital Signs: stable  Complications: No apparent anesthesia complications

## 2013-07-24 NOTE — Transfer of Care (Signed)
Immediate Anesthesia Transfer of Care Note  Patient: [name]  Procedure(s) Performed: Procedure(s): CESAREAN SECTION (N/A)  Patient Location: PACU  Anesthesia Type:Epidural  Level of Consciousness: awake, alert  and oriented  Airway & Oxygen Therapy: Patient Spontanous Breathing  Post-op Assessment: Report given to PACU RN and Post -op Vital signs reviewed and stable  Post vital signs: Reviewed and stable  Complications: No apparent anesthesia complications

## 2013-07-24 NOTE — Op Note (Signed)
preop diagnosis failed induction failure to progress in labor previous cesarean section at term Postop diagnosis repeat low transverse cesarean section Surgeon Dr. Francoise Ceo First assistant Dr. Coral Ceo Anesthesia epidural Procedure patient placed on the operating table in the supine position abdomen prepped and draped bladder emptied with a Foley catheter transverse suprapubic incision made through the old scar carried down to the rectus fascia the fascia cleaned and incised the length of the incision recti muscles retracted laterally peritoneum incised longitudinally a transverse incision made on the visceroperitoneum above the bladder and the bladder mobilized inferiorly transverse low uterine incision made fluid clear patient delivered from the LOP position of a female Apgar 9 and 9 was a loose nuchal cord the team was in attendance the placenta was posterior removed manually and sent to labor and delivery uterine cavity clean with dry laps the uterine incision closed in one layer with continuous suture of #1 chromic the bladder flap reattached with 2-0 chromic lap and sponge counts correct abdomen closed in layers peritoneum continuous with 2-0 chromic fascia continuous within of 0 Dexon and the skin closed with subcuticular stitch of 4-0 Monocryl blood loss was 900 cc patient tolerated the procedure well

## 2013-07-24 NOTE — Progress Notes (Signed)
UR chart review completed.  

## 2013-07-25 ENCOUNTER — Encounter (HOSPITAL_COMMUNITY): Payer: Self-pay | Admitting: Obstetrics

## 2013-07-25 LAB — CBC
HCT: 21.6 % — ABNORMAL LOW (ref 36.0–46.0)
Hemoglobin: 7.2 g/dL — ABNORMAL LOW (ref 12.0–15.0)
MCH: 25.6 pg — ABNORMAL LOW (ref 26.0–34.0)
MCHC: 33.3 g/dL (ref 30.0–36.0)
MCV: 76.9 fL — ABNORMAL LOW (ref 78.0–100.0)
RDW: 14.6 % (ref 11.5–15.5)

## 2013-07-25 NOTE — Progress Notes (Signed)
Patient ID: [name], female   DOB: 03/08/1986, 27 y.o.   MRN: [id number] Postop day 1 Vital signs normal Fundus firm Incision clean and dry Legs negative doing well

## 2013-07-25 NOTE — Lactation Note (Signed)
This note was copied from the chart of [name]. Lactation Consultation Note Follow up visit at 36 hours of age.  Mom is pumping with DEBP set up by [facility] RN.  Mom reports baby is eating every 2 hours, but  Documentation does not support that.  Mom reports pain with out visible nipple trauma.  Mom is sleepy and having a hard time remembering feedings.  Encouaraged to write feeding on yellow sheet at bedside.  Discussed possible poor latch due to mom's pain and encouraged mom to call for assist with latch.  Mom does reports hearing swallows. Baby asleep in visitors arms.  Encouraged mom it is ok not to see colostrum with pumping.  Encouraged mom to put baby to breast before pumping and express colostrum to rub into nipples.    Patient Name: Gabrielle Lavaun Greenfield EAVWU'J Date: 07/25/2013 Reason for consult: Follow-up assessment   Maternal Data Formula Feeding for Exclusion: Yes Reason for exclusion: Mother's choice to formula and breast feed on admission  Feeding Feeding Type: Breast Fed Length of feed: 20 min  LATCH Score/Interventions                      Lactation Tools Discussed/Used Initiated by:: Maggie RN Date initiated:: 07/26/13   Consult Status Consult Status: Follow-up Date: 07/26/13 Follow-up type: In-patient    Beverely Risen Arvella Merles 07/25/2013, 9:19 PM

## 2013-07-26 NOTE — Discharge Summary (Signed)
Obstetric Discharge Summary Reason for Admission: induction of labor Prenatal Procedures: none Intrapartum Procedures: cesarean: low cervical, transverse Postpartum Procedures: none Complications-Operative and Postpartum: none Hemoglobin  Date Value Range Status  07/25/2013 7.2* 12.0 - 15.0 g/dL Final     DELTA CHECK NOTED     REPEATED TO VERIFY     HCT  Date Value Range Status  07/25/2013 21.6* 36.0 - 46.0 % Final    Physical Exam:  General: alert Lochia: appropriate Uterine Fundus: firm Incision: healing well DVT Evaluation: No evidence of DVT seen on physical exam.  Discharge Diagnoses: Term Pregnancy-delivered  Discharge Information: Date: 07/26/2013 Activity: pelvic rest Diet: routine Medications: Percocet Condition: stable Instructions: refer to practice specific booklet Discharge to: home Follow-up Information   Follow up with Kathreen Cosier, MD.   Specialty:  Obstetrics and Gynecology   Contact information:   [address] [place] [address] [phone]       Newborn Data: Live born female  Birth Weight: 8 lb 1.6 oz (3675 g) APGAR: 9, 9  Home with mother.  Embry Manrique A 07/26/2013, 7:09 AM

## 2013-07-26 NOTE — Progress Notes (Signed)
Patient ID: [name], female   DOB: [birth year], 27 y.o.   MRN: [id number] Postop day 2   Vital signs normal Incision  Lochia moderate legs negative  discharged today to see me in 6 weeks

## 2013-08-06 ENCOUNTER — Encounter (HOSPITAL_COMMUNITY): Payer: Self-pay | Admitting: Emergency Medicine

## 2013-08-06 ENCOUNTER — Emergency Department (HOSPITAL_COMMUNITY)
Admission: EM | Admit: 2013-08-06 | Discharge: 2013-08-06 | Disposition: A | Discharge status: Home / Self Care | Payer: Medicaid Other | Source: Home / Self Care | Attending: Emergency Medicine | Admitting: Emergency Medicine

## 2013-08-06 DIAGNOSIS — G5603 Carpal tunnel syndrome, bilateral upper limbs: Secondary | ICD-10-CM

## 2013-08-06 DIAGNOSIS — G5600 Carpal tunnel syndrome, unspecified upper limb: Secondary | ICD-10-CM

## 2013-08-06 MED ORDER — PREDNISONE 10 MG PO TABS: ORAL_TABLET | ORAL | Status: DC

## 2013-08-06 NOTE — ED Notes (Signed)
C/o bilateral hand pain.  States "feels like hands are asleep"   Pt states that problem started during pregnancy and that after giving birth two weeks ago symptoms got worse.  Pain in right hand is radiating up forearm and pain in left hand is mainly felt around wrist.  Pt has not tried any otc meds for symptoms.

## 2013-08-06 NOTE — ED Provider Notes (Signed)
Chief Complaint:   Chief Complaint  Patient presents with  . Hand Pain    History of Present Illness:   [name] is a 27 year old [place] female who has had a 2-1/2 month history of pain and numbness in both of her hands, right worse than left. This began while she was pregnant in her seventh month. She delivered 2 weeks ago, but the discomfort has persisted. She denies any muscle weakness although she has difficulty making a fist on the right. The pain is worse first thing in the morning when she first gets up. She denies any pain in the neck, shoulders, or upper arms. Sometimes the pain radiates into the forearms.  Review of Systems:  Other than noted above, the patient denies any of the following symptoms: Systemic:  No fevers, chills, or sweats.  No fatigue or tiredness. Musculoskeletal:  No joint pain, arthritis, bursitis, swelling, back pain, or neck pain.  Neurological:  No muscular weakness, paresthesias.  PMFSH:  Past medical history, family history, social history, meds, and allergies were reviewed.   Physical Exam:   Vital signs:  BP 115/62  Pulse 70  Temp(Src) 98.5 F (36.9 C) (Oral)  Resp 16  Ht 5\' 1"  (1.549 m)  SpO2 99%  Breastfeeding? Yes Gen:  Alert and oriented times 3.  In no distress. Musculoskeletal:  Exam of the hand reveals no swelling, no pain to palpation over the carpal tunnels, the hands, or the joints. All joints have a full range of motion. Tinel's and Phalen's signs were negative.  Otherwise, all joints had a full a ROM with no swelling, bruising or deformity.  No edema, pulses full. Extremities were warm and pink.  Capillary refill was brisk.  Skin:  Clear, warm and dry.  No rash. Neuro:  Alert and oriented times 3.  Muscle strength was normal.  Sensation was intact to light touch.   Course in Urgent Care Center:   Patient was placed in bilateral wrist splints.  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  Pregnancy  is probably the causative factor.  Plan:   1.  Meds:  The following meds were prescribed:   Discharge Medication List as of 08/06/2013  1:06 PM    START taking these medications   Details  predniSONE (DELTASONE) 10 MG tablet Take 2 daily for 14 days, then 1 daily for 14 days., Print       The patient is still breast-feeding right now. I told her that that if she did decide to take the prednisone she would need to suspend breast-feeding.  2.  Patient Education/Counseling:  The patient was given appropriate handouts, self care instructions, and instructed in symptomatic relief, including rest and activity, elevation, application of ice and compression. She should wear the wrist splints continuously and given the exercises to do as well.  3.  Follow up:  The patient was told to follow up if no better in 3 to 4 days, if becoming worse in any way, and given some red flag symptoms such as worsening pain or muscle weakness which would prompt immediate return.  Follow up with Dr. Bradly Bienenstock if no better in one month.      Reuben Likes, MD 08/06/13 [phone]

## 2013-08-17 ENCOUNTER — Encounter (HOSPITAL_COMMUNITY): Payer: Self-pay | Admitting: Obstetrics

## 2013-08-17 NOTE — Addendum Note (Signed)
Addendum created 08/17/13 2007 by Dana AllanAmy Sieanna Vanstone, MD   Modules edited: Anesthesia Events

## 2013-09-13 ENCOUNTER — Other Ambulatory Visit: Payer: Self-pay | Admitting: Obstetrics

## 2013-09-21 ENCOUNTER — Encounter (HOSPITAL_COMMUNITY): Payer: Self-pay | Admitting: Pharmacist

## 2013-09-27 NOTE — H&P (Signed)
[name]:  [name], [name]                ACCOUNT NO.:  [id number]  MEDICAL RECORD NO.:  [id number]  LOCATION:                                 FACILITY:  PHYSICIAN:  [name], M.D.DATE OF BIRTH:  05-19-1986  DATE OF ADMISSION: DATE OF DISCHARGE:                             HISTORY & PHYSICAL   The patient is a 28 year old, gravida 3, para 2-0-1-2, who desires sterilization.  She understands procedure can fail resulting in pregnancy in the tube or uterus.  PAST MEDICAL HISTORY:  Negative.  PAST SURGICAL HISTORY:  She has had C-section x2.  SOCIAL HISTORY:  Negative.  SYSTEM REVIEW:  Noncontributory.  PHYSICAL EXAMINATION:  GENERAL:  Well-developed female in no distress. HEENT:  Negative. LUNGS:  Clear to P and A. HEART:  Regular rhythm.  No murmurs.  No gallops. BREASTS:  Negative. ABDOMEN:  Normal.  Uterus normal size.  Negative adnexa.  Cervix, vagina, external genitalia within normal limits. EXTREMITIES:  Negative.          ______________________________ [name], M.D.     BAM/MEDQ  D:  09/26/2013  T:  09/26/2013  Job:  [id number]

## 2013-10-01 ENCOUNTER — Encounter (HOSPITAL_COMMUNITY)
Admission: RE | Admit: 2013-10-01 | Discharge: 2013-10-01 | Disposition: A | Discharge status: Home / Self Care | Payer: Medicaid Other | Source: Ambulatory Visit | Attending: Obstetrics | Admitting: Obstetrics

## 2013-10-01 ENCOUNTER — Encounter (HOSPITAL_COMMUNITY): Payer: Self-pay

## 2013-10-01 DIAGNOSIS — Z01812 Encounter for preprocedural laboratory examination: Secondary | ICD-10-CM | POA: Insufficient documentation

## 2013-10-01 HISTORY — DX: Anemia, unspecified: D64.9

## 2013-10-01 LAB — CBC
HEMATOCRIT: 36 % (ref 36.0–46.0)
HEMOGLOBIN: 11.7 g/dL — AB (ref 12.0–15.0)
MCH: 23.3 pg — ABNORMAL LOW (ref 26.0–34.0)
MCHC: 32.5 g/dL (ref 30.0–36.0)
MCV: 71.6 fL — ABNORMAL LOW (ref 78.0–100.0)
Platelets: 277 10*3/uL (ref 150–400)
RBC: 5.03 MIL/uL (ref 3.87–5.11)
RDW: 16.5 % — ABNORMAL HIGH (ref 11.5–15.5)
WBC: 8 10*3/uL (ref 4.0–10.5)

## 2013-10-01 NOTE — Patient Instructions (Addendum)
   Your procedure is scheduled on: Wednesday, Feb 25  [facility]  945 AM Pick up the phone at the desk and dial [phone] and inform us of your arrival.  Please call this number if you have any problems the morning of surgery: [phone]  Remember: Do not eat or drink after midnight:  Tuesday Take these medicines the morning of surgery with a SIP OF WATER:  None  Do not wear jewelry, make-up, or FINGER nail polish No metal in your hair or on your body. Do not wear lotions, powders, perfumes.  You may wear deodorant.  Do not bring valuables to the hospital. Contacts, dentures or bridgework may not be worn into surgery.  Patients discharged on the day of surgery will not be allowed to drive home.  Home with fiance Elvera BickerDaniel [place] cell [phone]

## 2013-10-03 ENCOUNTER — Ambulatory Visit (HOSPITAL_COMMUNITY): Admission: RE | Admit: 2013-10-03 | Payer: Medicaid Other | Source: Ambulatory Visit | Admitting: Obstetrics

## 2013-10-03 ENCOUNTER — Encounter (HOSPITAL_COMMUNITY): Admission: RE | Payer: Self-pay | Source: Ambulatory Visit

## 2013-10-03 SURGERY — LIGATION, FALLOPIAN TUBE, LAPAROSCOPIC
Anesthesia: General

## 2013-10-30 ENCOUNTER — Other Ambulatory Visit: Payer: Self-pay | Admitting: Obstetrics

## 2013-11-13 ENCOUNTER — Encounter (HOSPITAL_COMMUNITY): Payer: Self-pay | Admitting: Pharmacist

## 2013-11-16 NOTE — H&P (Signed)
[name]:  [name], [name]                ACCOUNT NO.:  [id number]  MEDICAL RECORD NO.:  [id number]  LOCATION:                                 FACILITY:  PHYSICIAN:  Kathreen CosierBernard A. Yuvraj Pfeifer, M.D.DATE OF BIRTH:  1986-01-05  DATE OF ADMISSION: DATE OF DISCHARGE:                             HISTORY & PHYSICAL   HISTORY OF PRESENT ILLNESS:  The patient is a 28 year old, gravida 3, para 2-0-1-2, who desires sterilization.  She understands procedure can fail, resulting in pregnancy in tube or uterus.  PAST MEDICAL HISTORY:  Negative.  PAST SURGICAL HISTORY:  She had 2 C-sections in the past.  SOCIAL HISTORY:  Negative.  REVIEW OF SYSTEMS:  Noncontributory.  PHYSICAL EXAMINATION:  GENERAL:  A well-developed female, in no distress. HEENT:  Negative. BREASTS:  Negative. LUNGS:  Clear to P and A. HEART:  Regular rhythm.  No murmurs, no gallops. ABDOMEN:  Negative. PELVIC:  Uterus normal.  Negative adnexa.  Pap smear negative. EXTREMITIES:  Negative.   DICTATION ENDS HERE.          ______________________________ Kathreen CosierBernard A. [name], M.D.     BAM/MEDQ  D:  11/16/2013  T:  11/16/2013  Job:  [id number]

## 2013-11-20 MED ORDER — LACTATED RINGERS IV SOLN: INTRAVENOUS | Status: DC

## 2013-11-21 ENCOUNTER — Encounter (HOSPITAL_COMMUNITY)
Admission: RE | Disposition: A | Discharge status: Home / Self Care | Payer: Self-pay | Source: Ambulatory Visit | Attending: Obstetrics

## 2013-11-21 ENCOUNTER — Encounter (HOSPITAL_COMMUNITY): Payer: Medicaid Other | Admitting: Anesthesiology

## 2013-11-21 ENCOUNTER — Encounter (HOSPITAL_COMMUNITY): Payer: Self-pay | Admitting: Registered Nurse

## 2013-11-21 ENCOUNTER — Ambulatory Visit (HOSPITAL_COMMUNITY): Payer: Medicaid Other | Admitting: Anesthesiology

## 2013-11-21 ENCOUNTER — Ambulatory Visit (HOSPITAL_COMMUNITY)
Admission: RE | Admit: 2013-11-21 | Discharge: 2013-11-21 | Disposition: A | Discharge status: Home / Self Care | Payer: Medicaid Other | Source: Ambulatory Visit | Attending: Obstetrics | Admitting: Obstetrics

## 2013-11-21 DIAGNOSIS — D649 Anemia, unspecified: Secondary | ICD-10-CM | POA: Diagnosis not present

## 2013-11-21 DIAGNOSIS — Z302 Encounter for sterilization: Secondary | ICD-10-CM | POA: Diagnosis not present

## 2013-11-21 DIAGNOSIS — Z641 Problems related to multiparity: Secondary | ICD-10-CM | POA: Insufficient documentation

## 2013-11-21 HISTORY — PX: LAPAROSCOPIC TUBAL LIGATION: SHX1937

## 2013-11-21 LAB — CBC
HCT: 37.5 % (ref 36.0–46.0)
Hemoglobin: 12.1 g/dL (ref 12.0–15.0)
MCH: 24.1 pg — ABNORMAL LOW (ref 26.0–34.0)
MCHC: 32.3 g/dL (ref 30.0–36.0)
MCV: 74.7 fL — AB (ref 78.0–100.0)
Platelets: 231 10*3/uL (ref 150–400)
RBC: 5.02 MIL/uL (ref 3.87–5.11)
RDW: 17.4 % — ABNORMAL HIGH (ref 11.5–15.5)
WBC: 7.1 10*3/uL (ref 4.0–10.5)

## 2013-11-21 LAB — PREGNANCY, URINE: Preg Test, Ur: NEGATIVE

## 2013-11-21 SURGERY — LIGATION, FALLOPIAN TUBE, LAPAROSCOPIC
Anesthesia: General | Site: Abdomen | Laterality: Bilateral

## 2013-11-21 MED ORDER — FENTANYL CITRATE 0.05 MG/ML IJ SOLN
INTRAMUSCULAR | Status: AC
  Administered 2013-11-21: 50 ug via INTRAVENOUS
  Filled 2013-11-21: qty 2

## 2013-11-21 MED ORDER — KETOROLAC TROMETHAMINE 30 MG/ML IJ SOLN
INTRAMUSCULAR | Status: AC
  Filled 2013-11-21: qty 1

## 2013-11-21 MED ORDER — MIDAZOLAM HCL 5 MG/5ML IJ SOLN
INTRAMUSCULAR | Status: DC | PRN
  Administered 2013-11-21: 2 mg via INTRAVENOUS

## 2013-11-21 MED ORDER — ONDANSETRON HCL 4 MG/2ML IJ SOLN
INTRAMUSCULAR | Status: DC | PRN
  Administered 2013-11-21: 4 mg via INTRAVENOUS

## 2013-11-21 MED ORDER — SUCCINYLCHOLINE CHLORIDE 20 MG/ML IJ SOLN
INTRAMUSCULAR | Status: DC | PRN
  Administered 2013-11-21: 140 mg via INTRAVENOUS

## 2013-11-21 MED ORDER — FENTANYL CITRATE 0.05 MG/ML IJ SOLN
25.0000 ug | INTRAMUSCULAR | Status: DC | PRN
  Administered 2013-11-21 (×2): 50 ug via INTRAVENOUS

## 2013-11-21 MED ORDER — DEXAMETHASONE SODIUM PHOSPHATE 10 MG/ML IJ SOLN
INTRAMUSCULAR | Status: AC
  Filled 2013-11-21: qty 1

## 2013-11-21 MED ORDER — OXYCODONE-ACETAMINOPHEN 5-325 MG PO TABS
ORAL_TABLET | ORAL | Status: AC
  Filled 2013-11-21: qty 1

## 2013-11-21 MED ORDER — LIDOCAINE HCL (CARDIAC) 20 MG/ML IV SOLN
INTRAVENOUS | Status: AC
  Filled 2013-11-21: qty 5

## 2013-11-21 MED ORDER — FENTANYL CITRATE 0.05 MG/ML IJ SOLN
INTRAMUSCULAR | Status: AC
  Filled 2013-11-21: qty 5

## 2013-11-21 MED ORDER — PROPOFOL 10 MG/ML IV BOLUS
INTRAVENOUS | Status: DC | PRN
  Administered 2013-11-21: 170 mg via INTRAVENOUS

## 2013-11-21 MED ORDER — LIDOCAINE HCL (CARDIAC) 20 MG/ML IV SOLN
INTRAVENOUS | Status: DC | PRN
  Administered 2013-11-21: 50 mg via INTRAVENOUS

## 2013-11-21 MED ORDER — DEXAMETHASONE SODIUM PHOSPHATE 10 MG/ML IJ SOLN
INTRAMUSCULAR | Status: DC | PRN
  Administered 2013-11-21: 10 mg via INTRAVENOUS

## 2013-11-21 MED ORDER — LACTATED RINGERS IV SOLN
INTRAVENOUS | Status: DC
  Administered 2013-11-21 (×2): via INTRAVENOUS

## 2013-11-21 MED ORDER — MIDAZOLAM HCL 2 MG/2ML IJ SOLN
INTRAMUSCULAR | Status: AC
Start: 2013-11-21 — End: 2013-11-21
  Filled 2013-11-21: qty 2

## 2013-11-21 MED ORDER — ONDANSETRON HCL 4 MG/2ML IJ SOLN
INTRAMUSCULAR | Status: AC
  Filled 2013-11-21: qty 2

## 2013-11-21 MED ORDER — MIDAZOLAM HCL 2 MG/2ML IJ SOLN: 0.5000 mg | Freq: Once | INTRAMUSCULAR | Status: DC | PRN

## 2013-11-21 MED ORDER — FENTANYL CITRATE 0.05 MG/ML IJ SOLN
INTRAMUSCULAR | Status: DC | PRN
  Administered 2013-11-21: 50 ug via INTRAVENOUS
  Administered 2013-11-21: 150 ug via INTRAVENOUS

## 2013-11-21 MED ORDER — KETOROLAC TROMETHAMINE 30 MG/ML IJ SOLN: 15.0000 mg | Freq: Once | INTRAMUSCULAR | Status: DC | PRN

## 2013-11-21 MED ORDER — PROPOFOL 10 MG/ML IV EMUL
INTRAVENOUS | Status: AC
  Filled 2013-11-21: qty 20

## 2013-11-21 MED ORDER — OXYCODONE-ACETAMINOPHEN 5-325 MG PO TABS
1.0000 | ORAL_TABLET | ORAL | Status: DC | PRN
  Administered 2013-11-21: 1 via ORAL

## 2013-11-21 MED ORDER — KETOROLAC TROMETHAMINE 30 MG/ML IJ SOLN
INTRAMUSCULAR | Status: DC | PRN
  Administered 2013-11-21: 30 mg via INTRAVENOUS

## 2013-11-21 MED ORDER — PROMETHAZINE HCL 25 MG/ML IJ SOLN: 6.2500 mg | INTRAMUSCULAR | Status: DC | PRN

## 2013-11-21 MED ORDER — SUCCINYLCHOLINE CHLORIDE 20 MG/ML IJ SOLN
INTRAMUSCULAR | Status: AC
  Filled 2013-11-21: qty 10

## 2013-11-21 MED ORDER — MEPERIDINE HCL 25 MG/ML IJ SOLN: 6.2500 mg | INTRAMUSCULAR | Status: DC | PRN

## 2013-11-21 SURGICAL SUPPLY — 13 items
CATH ROBINSON RED A/P 16FR (CATHETERS) ×3 IMPLANT
CLOTH BEACON ORANGE TIMEOUT ST (SAFETY) ×3 IMPLANT
DERMABOND ADVANCED (GAUZE/BANDAGES/DRESSINGS) ×2
DERMABOND ADVANCED .7 DNX12 (GAUZE/BANDAGES/DRESSINGS) ×1 IMPLANT
GLOVE BIO SURGEON STRL SZ8.5 (GLOVE) ×6 IMPLANT
GOWN STRL REUS W/TWL 2XL LVL3 (GOWN DISPOSABLE) ×3 IMPLANT
GOWN STRL REUS W/TWL LRG LVL3 (GOWN DISPOSABLE) ×3 IMPLANT
PACK LAPAROSCOPY BASIN (CUSTOM PROCEDURE TRAY) ×3 IMPLANT
SUT MON AB 4-0 PS1 27 (SUTURE) ×3 IMPLANT
SUT VIC AB 0 CT1 27 (SUTURE) ×2
SUT VIC AB 0 CT1 27XBRD ANBCTR (SUTURE) ×1 IMPLANT
TOWEL OR 17X24 6PK STRL BLUE (TOWEL DISPOSABLE) ×6 IMPLANT
WATER STERILE IRR 1000ML POUR (IV SOLUTION) ×3 IMPLANT

## 2013-11-21 NOTE — Anesthesia Preprocedure Evaluation (Signed)
Anesthesia Evaluation  Patient identified by MRN, date of birth, ID band Patient awake    Reviewed: Allergy & Precautions, H&P , Patient's Chart, lab work & pertinent test results, reviewed documented beta blocker date and time   History of Anesthesia Complications Negative for: history of anesthetic complications  Airway Mallampati: II  TM Distance: >3 FB Neck ROM: full    Dental   Pulmonary  breath sounds clear to auscultation        Cardiovascular Exercise Tolerance: Good Rhythm:regular Rate:Normal     Neuro/Psych    GI/Hepatic   Endo/Other    Renal/GU      Musculoskeletal   Abdominal   Peds  Hematology  (+) anemia ,   Anesthesia Other Findings   Reproductive/Obstetrics                             Anesthesia Physical Anesthesia Plan  ASA: II  Anesthesia Plan: General ETT   Post-op Pain Management:    Induction:   Airway Management Planned:   Additional Equipment:   Intra-op Plan:   Post-operative Plan:   Informed Consent: I have reviewed the patients History and Physical, chart, labs and discussed the procedure including the risks, benefits and alternatives for the proposed anesthesia with the patient or authorized representative who has indicated his/her understanding and acceptance.   Dental Advisory Given  Plan Discussed with: CRNA and Surgeon  Anesthesia Plan Comments:         Anesthesia Quick Evaluation  

## 2013-11-21 NOTE — H&P (Signed)
There has been no change in the history and physical since her original dictation

## 2013-11-21 NOTE — Anesthesia Postprocedure Evaluation (Signed)
Anesthesia Post Note  Patient: [name]  Procedure(s) Performed: Procedure(s) (LRB): LAPAROSCOPIC TUBAL STERILIZATION  (Bilateral)  Anesthesia type: GA  Patient location: PACU  Post pain: Pain level controlled  Post assessment: Post-op Vital signs reviewed  Last Vitals:  Filed Vitals:   11/21/13 1211  BP: 110/58  Pulse: 76  Temp: 36.6 C  Resp: 24    Post vital signs: Reviewed  Level of consciousness: sedated  Complications: No apparent anesthesia complications

## 2013-11-21 NOTE — Transfer of Care (Signed)
Immediate Anesthesia Transfer of Care Note  Patient: [name]  Procedure(s) Performed: Procedure(s): LAPAROSCOPIC TUBAL STERILIZATION  (Bilateral)  Patient Location: PACU  Anesthesia Type:General  Level of Consciousness: awake  Airway & Oxygen Therapy: Patient Spontanous Breathing and Patient connected to nasal cannula oxygen  Post-op Assessment: Report given to PACU RN  Post vital signs: Reviewed  Complications: No apparent anesthesia complications

## 2013-11-21 NOTE — Op Note (Signed)
Preop diagnosis multiparity Postop diagnosis is same Surgeon Dr. Francoise CeoBernard Reace Breshears Anesthesia Gen. Open laparoscopic tubal sterilization Procedure on the general anesthesia patient in the lithotomy position abdomen prepped and draped bladder emptied with a straight catheter speculum placed in the vagina and the cervix grasped with a Hulka tenaculum in the umbilicus a transverse incision made carried down to the fascia the fascia cleaned grasped to cochleas and the fascia and the peritoneum opened the Mayo scissors the sleeve of the trocar was inserted intraperitoneally and 3 L carbon dioxide and he was intraperitoneally visit lysing scope was inserted through the sleeve of the trocar uterus tubes and ovaries normal cautery probe inserted through the sleeve of the scope the right tube  Grasped  1 inch from the  Cornu  and cauterize the tube was cauterized a total of 4 places  Moving lateral from  the first site of cautery the procedure was done in a similar fashion on the other side the CO2 was allowed to escape from the peritoneal cavity instruments removed the fascia closed with one stitch of 0 Vicryl and the skin shows a subcuticular stitch of 4-0 Monocryl

## 2013-11-21 NOTE — Discharge Instructions (Signed)
DISCHARGE INSTRUCTIONS: Laparoscopy  MAY TAKE IBUPROFEN (MOTRIN, ADVIL) OR ALEVE AFTER 6:00 PM FOR CRAMPS!!!  The following instructions have been prepared to help you care for yourself upon your return home today.  Wound care:  Do not get the incision wet for the first 24 hours. The incision should be kept clean and dry.  The Band-Aids or dressings may be removed the day after surgery.  Should the incision become sore, red, and swollen after the first week, check with your doctor.  Personal hygiene:  Shower the day after your procedure.  Activity and limitations:  Do NOT drive or operate any equipment today.  Do NOT lift anything more than 15 pounds for 2-3 weeks after surgery.  Do NOT rest in bed all day.  Walking is encouraged. Walk each day, starting slowly with 5-minute walks 3 or 4 times a day. Slowly increase the length of your walks.  Walk up and down stairs slowly.  Do NOT do strenuous activities, such as golfing, playing tennis, bowling, running, biking, weight lifting, gardening, mowing, or vacuuming for 2-4 weeks. Ask your doctor when it is okay to start.  Diet: Eat a light meal as desired this evening. You may resume your usual diet tomorrow.  Return to work: This is dependent on the type of work you do. For the most part you can return to a desk job within a week of surgery. If you are more active at work, please discuss this with your doctor.  What to expect after your surgery: You may have a slight burning sensation when you urinate on the first day. You may have a very small amount of blood in the urine. Expect to have a small amount of vaginal discharge/light bleeding for 1-2 weeks. It is not unusual to have abdominal soreness and bruising for up to 2 weeks. You may be tired and need more rest for about 1 week. You may experience shoulder pain for 24-72 hours. Lying flat in bed may relieve it.  Call your doctor for any of the following:  Develop a fever of  100.4 or greater  Inability to urinate 6 hours after discharge from hospital  Severe pain not relieved by pain medications  Persistent of heavy bleeding at incision site  Redness or swelling around incision site after a week  Increasing nausea or vomiting  Patient Signature________________________________________ Nurse Signature_________________________________________

## 2013-11-22 ENCOUNTER — Encounter (HOSPITAL_COMMUNITY): Payer: Self-pay | Admitting: Obstetrics

## 2014-06-10 ENCOUNTER — Encounter (HOSPITAL_COMMUNITY): Payer: Self-pay | Admitting: Obstetrics

## 2016-05-05 ENCOUNTER — Encounter: Payer: Self-pay | Admitting: *Deleted

## 2019-07-12 ENCOUNTER — Encounter (HOSPITAL_COMMUNITY): Payer: Self-pay

## 2019-07-12 ENCOUNTER — Ambulatory Visit (HOSPITAL_COMMUNITY)
Admission: EM | Admit: 2019-07-12 | Discharge: 2019-07-12 | Disposition: A | Discharge status: Home / Self Care | Payer: Self-pay | Attending: Internal Medicine | Admitting: Internal Medicine

## 2019-07-12 ENCOUNTER — Other Ambulatory Visit: Payer: Self-pay

## 2019-07-12 DIAGNOSIS — B9789 Other viral agents as the cause of diseases classified elsewhere: Secondary | ICD-10-CM

## 2019-07-12 DIAGNOSIS — Z20828 Contact with and (suspected) exposure to other viral communicable diseases: Secondary | ICD-10-CM | POA: Insufficient documentation

## 2019-07-12 DIAGNOSIS — R05 Cough: Secondary | ICD-10-CM | POA: Insufficient documentation

## 2019-07-12 DIAGNOSIS — J069 Acute upper respiratory infection, unspecified: Secondary | ICD-10-CM | POA: Insufficient documentation

## 2019-07-12 MED ORDER — BENZONATATE 100 MG PO CAPS: 100.0000 mg | ORAL_CAPSULE | Freq: Three times a day (TID) | ORAL | 0 refills | Status: AC

## 2019-07-12 NOTE — ED Triage Notes (Signed)
Patient presents to Urgent Care with complaints of dry cough since two days ago. Patient reports she thinks she has coughed so much she has upper back pain, cough is worse at night.

## 2019-07-12 NOTE — ED Provider Notes (Signed)
[facility]    CSN: [id number] Arrival date & time: 07/12/19  1038      History   Chief Complaint Chief Complaint  Patient presents with  . Cough    HPI [name] is a 33 y.o. female with no past medical history comes to urgent care with complaint of cough of 1 day duration.  Patient says cough has been persistent since this started.  It is not productive of sputum.  No shortness of breath or wheezing.  Patient has seasonal allergies currently does not take any medications for that.  She denies any sick contacts.  No fever or chills.  No nausea, vomiting or diarrhea.  Patient has sharp back pain which worsens with cough.  No trauma to the chest.  HPI  Past Medical History:  Diagnosis Date  . Anemia   . Trichomonas    treated    Patient Active Problem List   Diagnosis Date Noted  . S/P cesarean section 07/24/2013  . Personal history of previous postdates pregnancy 07/23/2013    Past Surgical History:  Procedure Laterality Date  . CESAREAN SECTION  10/04/2008   [facility]  . CESAREAN SECTION N/A 07/24/2013   Procedure: CESAREAN SECTION;  Surgeon: Kathreen Cosier, MD;  Location: [facility];  Service: Obstetrics;  Laterality: N/A;  . LAPAROSCOPIC TUBAL LIGATION Bilateral 11/21/2013   Procedure: LAPAROSCOPIC TUBAL STERILIZATION ;  Surgeon: Kathreen Cosier, MD;  Location: [facility];  Service: Gynecology;  Laterality: Bilateral;    OB History    Gravida  3   Para  2   Term  2   Preterm  0   AB  1   Living  2     SAB  0   TAB  1   Ectopic  0   Multiple  0   Live Births  2            Home Medications    Prior to Admission medications   Medication Sig Start Date End Date Taking? Authorizing Provider  benzonatate (TESSALON) 100 MG capsule Take 1 capsule (100 mg total) by mouth every 8 (eight) hours. 07/12/19   LampteyBritta Mccreedy, MD    Family History Family History  Problem Relation Age of Onset  . Diabetes Mother   . Hypertension Mother   .  Healthy Father   . Alcohol abuse Neg Hx   . Arthritis Neg Hx   . Asthma Neg Hx   . Birth defects Neg Hx   . Cancer Neg Hx   . COPD Neg Hx   . Depression Neg Hx   . Drug abuse Neg Hx   . Early death Neg Hx   . Hearing loss Neg Hx   . Heart disease Neg Hx   . Hyperlipidemia Neg Hx   . Kidney disease Neg Hx   . Learning disabilities Neg Hx   . Mental illness Neg Hx   . Mental retardation Neg Hx   . Miscarriages / Stillbirths Neg Hx   . Stroke Neg Hx   . Vision loss Neg Hx   . Varicose Veins Neg Hx     Social History Social History   Tobacco Use  . Smoking status: Never Smoker  . Smokeless tobacco: Never Used  Substance Use Topics  . Alcohol use: Yes    Comment: ocassional  . Drug use: No     Allergies   Patient has no known allergies.   Review of Systems Review of Systems  Constitutional: Negative.   HENT: Negative for congestion, mouth sores, sinus pressure, sinus pain and sore throat.   Respiratory: Positive for cough. Negative for chest tightness, shortness of breath and wheezing.   Cardiovascular: Positive for chest pain. Negative for palpitations.  Gastrointestinal: Negative.  Negative for diarrhea, nausea and vomiting.  Musculoskeletal: Negative.  Negative for arthralgias, joint swelling and neck stiffness.  Skin: Negative.   Neurological: Negative.  Negative for weakness, light-headedness and headaches.     Physical Exam Triage Vital Signs ED Triage Vitals  Enc Vitals Group     BP 07/12/19 1129 110/79     Pulse Rate 07/12/19 1129 77     Resp 07/12/19 1129 16     Temp 07/12/19 1129 98.5 F (36.9 C)     Temp Source 07/12/19 1129 Oral     SpO2 07/12/19 1129 97 %     Weight --      Height --      Head Circumference --      Peak Flow --      Pain Score 07/12/19 1127 6     Pain Loc --      Pain Edu? --      Excl. in [place]? --    No data found.  Updated Vital Signs BP 110/79 (BP Location: Right Arm)   Pulse 77   Temp 98.5 F (36.9 C) (Oral)    Resp 16   SpO2 97%   Visual Acuity Right Eye Distance:   Left Eye Distance:   Bilateral Distance:    Right Eye Near:   Left Eye Near:    Bilateral Near:     Physical Exam Vitals signs and nursing note reviewed.  Constitutional:      General: She is not in acute distress.    Appearance: She is not ill-appearing.  HENT:     Right Ear: Tympanic membrane normal.     Left Ear: Tympanic membrane normal.     Nose: No congestion or rhinorrhea.     Mouth/Throat:     Mouth: Mucous membranes are moist.     Pharynx: No oropharyngeal exudate or posterior oropharyngeal erythema.  Cardiovascular:     Rate and Rhythm: Normal rate and regular rhythm.     Pulses: Normal pulses.     Heart sounds: No murmur. No friction rub.  Pulmonary:     Effort: Pulmonary effort is normal. No respiratory distress.     Breath sounds: No wheezing or rhonchi.  Abdominal:     General: Bowel sounds are normal. There is no distension.     Palpations: Abdomen is soft.     Tenderness: There is no guarding or rebound.  Musculoskeletal: Normal range of motion.        General: No swelling or tenderness.  Skin:    General: Skin is warm.     Capillary Refill: Capillary refill takes less than 2 seconds.     Coloration: Skin is not jaundiced.     Findings: No erythema.  Neurological:     General: No focal deficit present.     Mental Status: She is alert and oriented to person, place, and time.      UC Treatments / Results  Labs (all labs ordered are listed, but only abnormal results are displayed) Labs Reviewed  NOVEL CORONAVIRUS, NAA (HOSP ORDER, SEND-OUT TO REF LAB; TAT 18-24 HRS)    EKG   Radiology No results found.  Procedures Procedures (including critical care time)  Medications Ordered in  UC Medications - No data to display  Initial Impression / Assessment and Plan / UC Course  I have reviewed the triage vital signs and the nursing notes.  Pertinent labs & imaging results that were  available during my care of the patient were reviewed by me and considered in my medical decision making (see chart for details).     1.  Cough likely secondary to viral infection: Tessalon Perles as needed for cough COVID-19 testing Patient is advised to self isolate until COVID-19 test results are available If patient's condition worsens i.e. she develops fever, persistent shortness of breath she is advised to return to urgent care to be reevaluated. Final Clinical Impressions(s) / UC Diagnoses   Final diagnoses:  Viral URI with cough   Discharge Instructions   None    ED Prescriptions    Medication Sig Dispense Auth. Provider   benzonatate (TESSALON) 100 MG capsule Take 1 capsule (100 mg total) by mouth every 8 (eight) hours. 30 capsule , Britta Mccreedy O, MD     PDMP not reviewed this encounter.   Merrilee Jansky,  O, MD 07/12/19 [phone]

## 2019-07-13 LAB — NOVEL CORONAVIRUS, NAA (HOSP ORDER, SEND-OUT TO REF LAB; TAT 18-24 HRS): SARS-CoV-2, NAA: NOT DETECTED

## 2020-07-31 ENCOUNTER — Ambulatory Visit (HOSPITAL_COMMUNITY)
Admission: EM | Admit: 2020-07-31 | Discharge: 2020-07-31 | Disposition: A | Discharge status: Home / Self Care | Payer: Self-pay

## 2022-08-31 DIAGNOSIS — Z13220 Encounter for screening for lipoid disorders: Secondary | ICD-10-CM | POA: Diagnosis not present

## 2022-08-31 DIAGNOSIS — Z0001 Encounter for general adult medical examination with abnormal findings: Secondary | ICD-10-CM | POA: Diagnosis not present

## 2022-08-31 DIAGNOSIS — J45990 Exercise induced bronchospasm: Secondary | ICD-10-CM | POA: Diagnosis not present

## 2022-08-31 DIAGNOSIS — E663 Overweight: Secondary | ICD-10-CM | POA: Diagnosis not present

## 2022-08-31 DIAGNOSIS — R0602 Shortness of breath: Secondary | ICD-10-CM | POA: Diagnosis not present

## 2022-08-31 DIAGNOSIS — Z131 Encounter for screening for diabetes mellitus: Secondary | ICD-10-CM | POA: Diagnosis not present

## 2022-09-14 DIAGNOSIS — Z13220 Encounter for screening for lipoid disorders: Secondary | ICD-10-CM | POA: Diagnosis not present

## 2022-09-14 DIAGNOSIS — Z0001 Encounter for general adult medical examination with abnormal findings: Secondary | ICD-10-CM | POA: Diagnosis not present

## 2022-09-14 DIAGNOSIS — J45990 Exercise induced bronchospasm: Secondary | ICD-10-CM | POA: Diagnosis not present

## 2022-09-14 DIAGNOSIS — J4520 Mild intermittent asthma, uncomplicated: Secondary | ICD-10-CM | POA: Diagnosis not present

## 2022-09-14 DIAGNOSIS — R0602 Shortness of breath: Secondary | ICD-10-CM | POA: Diagnosis not present

## 2022-09-14 DIAGNOSIS — E663 Overweight: Secondary | ICD-10-CM | POA: Diagnosis not present

## 2022-09-14 DIAGNOSIS — Z131 Encounter for screening for diabetes mellitus: Secondary | ICD-10-CM | POA: Diagnosis not present

## 2022-10-12 DIAGNOSIS — Z124 Encounter for screening for malignant neoplasm of cervix: Secondary | ICD-10-CM | POA: Diagnosis not present

## 2022-10-12 DIAGNOSIS — R87610 Atypical squamous cells of undetermined significance on cytologic smear of cervix (ASC-US): Secondary | ICD-10-CM | POA: Diagnosis not present

## 2023-03-21 DIAGNOSIS — Z6829 Body mass index (BMI) 29.0-29.9, adult: Secondary | ICD-10-CM | POA: Diagnosis not present

## 2023-03-21 DIAGNOSIS — Z113 Encounter for screening for infections with a predominantly sexual mode of transmission: Secondary | ICD-10-CM | POA: Diagnosis not present

## 2023-03-21 DIAGNOSIS — R739 Hyperglycemia, unspecified: Secondary | ICD-10-CM | POA: Diagnosis not present

## 2023-03-21 DIAGNOSIS — J4520 Mild intermittent asthma, uncomplicated: Secondary | ICD-10-CM | POA: Diagnosis not present

## 2023-03-21 DIAGNOSIS — Z0000 Encounter for general adult medical examination without abnormal findings: Secondary | ICD-10-CM | POA: Diagnosis not present

## 2023-03-21 DIAGNOSIS — E781 Pure hyperglyceridemia: Secondary | ICD-10-CM | POA: Diagnosis not present

## 2023-05-16 ENCOUNTER — Emergency Department (HOSPITAL_COMMUNITY)
Admission: EM | Admit: 2023-05-16 | Discharge: 2023-05-17 | Discharge status: Against Medical Advice | Payer: 59 | Attending: Student | Admitting: Student

## 2023-05-16 DIAGNOSIS — R112 Nausea with vomiting, unspecified: Secondary | ICD-10-CM | POA: Diagnosis not present

## 2023-05-16 DIAGNOSIS — R42 Dizziness and giddiness: Secondary | ICD-10-CM | POA: Insufficient documentation

## 2023-05-16 DIAGNOSIS — Z5321 Procedure and treatment not carried out due to patient leaving prior to being seen by health care provider: Secondary | ICD-10-CM | POA: Insufficient documentation

## 2023-05-17 ENCOUNTER — Other Ambulatory Visit: Payer: Self-pay

## 2023-05-17 ENCOUNTER — Encounter (HOSPITAL_COMMUNITY): Payer: Self-pay

## 2023-05-17 DIAGNOSIS — R42 Dizziness and giddiness: Secondary | ICD-10-CM | POA: Diagnosis not present

## 2023-05-17 LAB — CBC
HCT: 34.5 % — ABNORMAL LOW (ref 36.0–46.0)
Hemoglobin: 11.2 g/dL — ABNORMAL LOW (ref 12.0–15.0)
MCH: 26.3 pg (ref 26.0–34.0)
MCHC: 32.5 g/dL (ref 30.0–36.0)
MCV: 81 fL (ref 80.0–100.0)
Platelets: 212 10*3/uL (ref 150–400)
RBC: 4.26 MIL/uL (ref 3.87–5.11)
RDW: 14.1 % (ref 11.5–15.5)
WBC: 10.8 10*3/uL — ABNORMAL HIGH (ref 4.0–10.5)
nRBC: 0 % (ref 0.0–0.2)

## 2023-05-17 LAB — HCG, SERUM, QUALITATIVE: Preg, Serum: NEGATIVE

## 2023-05-17 LAB — BASIC METABOLIC PANEL
Anion gap: 10 (ref 5–15)
BUN: 11 mg/dL (ref 6–20)
CO2: 23 mmol/L (ref 22–32)
Calcium: 8.7 mg/dL — ABNORMAL LOW (ref 8.9–10.3)
Chloride: 102 mmol/L (ref 98–111)
Creatinine, Ser: 0.9 mg/dL (ref 0.44–1.00)
GFR, Estimated: >60 mL/min (ref >60)
Glucose, Bld: 112 mg/dL — ABNORMAL HIGH (ref 70–99)
Potassium: 3.7 mmol/L (ref 3.5–5.1)
Sodium: 135 mmol/L (ref 135–145)

## 2023-05-17 LAB — CBG MONITORING, ED: Glucose-Capillary: 102 mg/dL — ABNORMAL HIGH (ref 70–99)

## 2023-05-17 MED ORDER — ONDANSETRON 4 MG PO TBDP
4.0000 mg | ORAL_TABLET | Freq: Once | ORAL | Status: AC
  Administered 2023-05-17: 4 mg via ORAL
  Filled 2023-05-17: qty 1

## 2023-05-17 MED ORDER — ONDANSETRON HCL 4 MG/2ML IJ SOLN: 4.0000 mg | Freq: Once | INTRAMUSCULAR | Status: DC

## 2023-05-17 NOTE — ED Notes (Signed)
Name called for updated vitals, no response 

## 2023-05-17 NOTE — ED Triage Notes (Signed)
Pt complaining of being dizzy, nausea and vomiting for about 3 hours now. Was not feeling bad before that.

## 2023-05-31 DIAGNOSIS — J4520 Mild intermittent asthma, uncomplicated: Secondary | ICD-10-CM | POA: Diagnosis not present

## 2023-05-31 DIAGNOSIS — R42 Dizziness and giddiness: Secondary | ICD-10-CM | POA: Diagnosis not present

## 2023-05-31 DIAGNOSIS — N289 Disorder of kidney and ureter, unspecified: Secondary | ICD-10-CM | POA: Diagnosis not present

## 2023-05-31 DIAGNOSIS — E781 Pure hyperglyceridemia: Secondary | ICD-10-CM | POA: Diagnosis not present

## 2023-05-31 DIAGNOSIS — Z6829 Body mass index (BMI) 29.0-29.9, adult: Secondary | ICD-10-CM | POA: Diagnosis not present

## 2023-08-18 ENCOUNTER — Encounter (HOSPITAL_COMMUNITY): Payer: Self-pay

## 2023-08-18 ENCOUNTER — Emergency Department (HOSPITAL_COMMUNITY)
Admission: EM | Admit: 2023-08-18 | Discharge: 2023-08-18 | Discharge status: Against Medical Advice | Payer: 59 | Attending: Emergency Medicine | Admitting: Emergency Medicine

## 2023-08-18 ENCOUNTER — Other Ambulatory Visit: Payer: Self-pay

## 2023-08-18 DIAGNOSIS — J209 Acute bronchitis, unspecified: Secondary | ICD-10-CM | POA: Diagnosis not present

## 2023-08-18 DIAGNOSIS — M791 Myalgia, unspecified site: Secondary | ICD-10-CM | POA: Insufficient documentation

## 2023-08-18 DIAGNOSIS — R0981 Nasal congestion: Secondary | ICD-10-CM | POA: Diagnosis not present

## 2023-08-18 DIAGNOSIS — R059 Cough, unspecified: Secondary | ICD-10-CM | POA: Diagnosis not present

## 2023-08-18 DIAGNOSIS — Z5321 Procedure and treatment not carried out due to patient leaving prior to being seen by health care provider: Secondary | ICD-10-CM | POA: Insufficient documentation

## 2023-08-18 DIAGNOSIS — Z20822 Contact with and (suspected) exposure to covid-19: Secondary | ICD-10-CM | POA: Diagnosis not present

## 2023-08-18 DIAGNOSIS — R062 Wheezing: Secondary | ICD-10-CM | POA: Diagnosis not present

## 2023-08-18 LAB — RESP PANEL BY RT-PCR (RSV, FLU A&B, COVID)  RVPGX2
Influenza A by PCR: NEGATIVE
Influenza B by PCR: NEGATIVE
Resp Syncytial Virus by PCR: NEGATIVE
SARS Coronavirus 2 by RT PCR: NEGATIVE

## 2023-08-18 NOTE — ED Triage Notes (Signed)
 Pt states she has had congestion, unproductive cough, and body aches x 2 days

## 2023-08-18 NOTE — ED Notes (Signed)
 Pt was called for vitals and was not present in the building.

## 2023-08-31 DIAGNOSIS — R062 Wheezing: Secondary | ICD-10-CM | POA: Diagnosis not present
